# Patient Record
Sex: Male | Born: 1960 | Race: White | Hispanic: No | Marital: Married | Smoking: Never smoker
Health system: Southern US, Community
[De-identification: ages and names within clinical notes are randomized; demographics above are authoritative.]

## PROBLEM LIST (undated history)

## (undated) DIAGNOSIS — Z5189 Encounter for other specified aftercare: Secondary | ICD-10-CM

## (undated) DIAGNOSIS — D500 Iron deficiency anemia secondary to blood loss (chronic): Secondary | ICD-10-CM

## (undated) DIAGNOSIS — K651 Peritoneal abscess: Secondary | ICD-10-CM

## (undated) DIAGNOSIS — K922 Gastrointestinal hemorrhage, unspecified: Secondary | ICD-10-CM

## (undated) DIAGNOSIS — R51 Headache: Secondary | ICD-10-CM

## (undated) DIAGNOSIS — C189 Malignant neoplasm of colon, unspecified: Secondary | ICD-10-CM

## (undated) DIAGNOSIS — D649 Anemia, unspecified: Secondary | ICD-10-CM

## (undated) HISTORY — PX: COLON SURGERY: SHX602

## (undated) HISTORY — PX: ABDOMINAL EXPLORATION SURGERY: SHX538

## (undated) HISTORY — PX: TONSILLECTOMY: SUR1361

## (undated) HISTORY — DX: Anemia, unspecified: D64.9

## (undated) HISTORY — DX: Encounter for other specified aftercare: Z51.89

---

## 2011-06-09 HISTORY — PX: SMALL INTESTINE SURGERY: SHX150

## 2012-05-25 ENCOUNTER — Other Ambulatory Visit: Payer: Self-pay

## 2012-05-25 ENCOUNTER — Encounter (HOSPITAL_COMMUNITY): Admission: EM | Disposition: A | Discharge status: Home / Self Care | Payer: Self-pay | Source: Home / Self Care

## 2012-05-25 ENCOUNTER — Emergency Department (HOSPITAL_COMMUNITY): Payer: Managed Care, Other (non HMO)

## 2012-05-25 ENCOUNTER — Encounter (HOSPITAL_COMMUNITY): Payer: Self-pay | Admitting: *Deleted

## 2012-05-25 ENCOUNTER — Inpatient Hospital Stay (HOSPITAL_COMMUNITY)
Admission: EM | Admit: 2012-05-25 | Discharge: 2012-06-03 | DRG: 329 | Disposition: A | Discharge status: Home / Self Care | Payer: Managed Care, Other (non HMO) | Attending: General Surgery | Admitting: General Surgery

## 2012-05-25 DIAGNOSIS — K658 Other peritonitis: Secondary | ICD-10-CM | POA: Diagnosis present

## 2012-05-25 DIAGNOSIS — Y832 Surgical operation with anastomosis, bypass or graft as the cause of abnormal reaction of the patient, or of later complication, without mention of misadventure at the time of the procedure: Secondary | ICD-10-CM | POA: Diagnosis present

## 2012-05-25 DIAGNOSIS — K2980 Duodenitis without bleeding: Secondary | ICD-10-CM | POA: Diagnosis present

## 2012-05-25 DIAGNOSIS — D500 Iron deficiency anemia secondary to blood loss (chronic): Secondary | ICD-10-CM | POA: Clinically undetermined

## 2012-05-25 DIAGNOSIS — K922 Gastrointestinal hemorrhage, unspecified: Secondary | ICD-10-CM | POA: Clinically undetermined

## 2012-05-25 DIAGNOSIS — K929 Disease of digestive system, unspecified: Secondary | ICD-10-CM | POA: Diagnosis not present

## 2012-05-25 DIAGNOSIS — K560 Paralytic ileus: Secondary | ICD-10-CM | POA: Diagnosis not present

## 2012-05-25 DIAGNOSIS — Z85038 Personal history of other malignant neoplasm of large intestine: Secondary | ICD-10-CM

## 2012-05-25 DIAGNOSIS — K254 Chronic or unspecified gastric ulcer with hemorrhage: Secondary | ICD-10-CM | POA: Diagnosis present

## 2012-05-25 DIAGNOSIS — K631 Perforation of intestine (nontraumatic): Secondary | ICD-10-CM

## 2012-05-25 DIAGNOSIS — K668 Other specified disorders of peritoneum: Secondary | ICD-10-CM

## 2012-05-25 DIAGNOSIS — K55059 Acute (reversible) ischemia of intestine, part and extent unspecified: Secondary | ICD-10-CM

## 2012-05-25 DIAGNOSIS — K91858 Other complications of intestinal pouch: Principal | ICD-10-CM | POA: Diagnosis present

## 2012-05-25 DIAGNOSIS — K651 Peritoneal abscess: Secondary | ICD-10-CM | POA: Clinically undetermined

## 2012-05-25 DIAGNOSIS — Z860100 Personal history of colon polyps, unspecified: Secondary | ICD-10-CM

## 2012-05-25 HISTORY — DX: Iron deficiency anemia secondary to blood loss (chronic): D50.0

## 2012-05-25 HISTORY — DX: Peritoneal abscess: K65.1

## 2012-05-25 HISTORY — DX: Gastrointestinal hemorrhage, unspecified: K92.2

## 2012-05-25 HISTORY — DX: Malignant neoplasm of colon, unspecified: C18.9

## 2012-05-25 LAB — COMPREHENSIVE METABOLIC PANEL
AST: 28 U/L (ref 0–37)
Albumin: 4 g/dL (ref 3.5–5.2)
BUN: 19 mg/dL (ref 6–23)
Calcium: 9.3 mg/dL (ref 8.4–10.5)
Creatinine, Ser: 1.17 mg/dL (ref 0.50–1.35)
Total Protein: 7 g/dL (ref 6.0–8.3)

## 2012-05-25 LAB — LIPASE, BLOOD: Lipase: 67 U/L — ABNORMAL HIGH (ref 11–59)

## 2012-05-25 LAB — CBC
HCT: 45.8 % (ref 39.0–52.0)
MCHC: 34.9 g/dL (ref 30.0–36.0)
MCV: 89.6 fL (ref 78.0–100.0)
Platelets: 233 10*3/uL (ref 150–400)
RDW: 12.4 % (ref 11.5–15.5)

## 2012-05-25 LAB — LACTIC ACID, PLASMA: Lactic Acid, Venous: 5.8 mmol/L — ABNORMAL HIGH (ref 0.5–2.2)

## 2012-05-25 SURGERY — LAPAROTOMY, EXPLORATORY
Anesthesia: General

## 2012-05-25 MED ORDER — ONDANSETRON HCL 4 MG/2ML IJ SOLN
4.0000 mg | Freq: Once | INTRAMUSCULAR | Status: AC
  Administered 2012-05-25: 4 mg via INTRAVENOUS
  Filled 2012-05-25: qty 2

## 2012-05-25 MED ORDER — HYDROMORPHONE HCL PF 1 MG/ML IJ SOLN
1.0000 mg | Freq: Once | INTRAMUSCULAR | Status: AC
  Administered 2012-05-25: 1 mg via INTRAVENOUS
  Filled 2012-05-25: qty 1

## 2012-05-25 MED ORDER — ONDANSETRON HCL 4 MG/2ML IJ SOLN
INTRAMUSCULAR | Status: AC
  Filled 2012-05-25: qty 2

## 2012-05-25 MED ORDER — DEXTROSE 5 % IV SOLN
2.0000 g | INTRAVENOUS | Status: DC
  Filled 2012-05-25: qty 2

## 2012-05-25 NOTE — ED Provider Notes (Signed)
Medical screening examination/treatment/procedure(s) were conducted as a shared visit with non-physician practitioner(s) and myself.  I personally evaluated the patient during the encounter  Pt with prior colectomy now having sudden onset severe abdominal pain, rigid abdomen, free air. Surgical consult.   Charles B. Bernette Mayers, MD 05/25/12 2333

## 2012-05-25 NOTE — ED Provider Notes (Signed)
History     CSN: [id number]  Arrival date & time 05/25/12  2122   First MD Initiated Contact with Patient 05/25/12 2159      Chief Complaint  Patient presents with  . Emesis  . Abdominal Pain    (Consider location/radiation/quality/duration/timing/severity/associated sxs/prior treatment) HPI Patient presents emergency department with sudden onset of abdominal pain, nausea, vomiting, at about 8:30.  Patient, states he was eating dinner when he started with a sudden onset of abdominal pain, with nausea, and vomiting.  Patient, states that he's had a total colectomy, 2 and half years ago.  Patient denies any other past medical problems.  Patient denies taking any medications.  Patient, states that movement and palpation make his discomfort, worse.  Patient, also, states he started with discomfort between both shoulder blades at the onset of the pain.  Patient denies chest pain, syncope, dizziness, visual changes, dysuria, diarrhea, or hematemesis.  Past Medical History  Diagnosis Date  . Colon cancer     Past Surgical History  Procedure Date  . Colon surgery     History reviewed. No pertinent family history.  History  Substance Use Topics  . Smoking status: Never Smoker   . Smokeless tobacco: Not on file  . Alcohol Use: Yes     Comment: occasionally      Review of Systems All other systems negative except as documented in the HPI. All pertinent positives and negatives as reviewed in the HPI.  Allergies  Review of patient's allergies indicates no known allergies.  Home Medications   Current Outpatient Rx  Name  Route  Sig  Dispense  Refill  . LOPERAMIDE HCL 2 MG PO CAPS   Oral   Take 6 mg by mouth 2 (two) times daily. Slowing down of bowel movement           BP 135/75  Pulse 85  Temp 97.5 F (36.4 C) (Oral)  Resp 16  SpO2 96%  Physical Exam  Nursing note and vitals reviewed. Constitutional: He is oriented to person, place, and time. He appears  well-developed and well-nourished.  HENT:  Head: Normocephalic and atraumatic.  Mouth/Throat: Oropharynx is clear and moist.  Eyes: Pupils are equal, round, and reactive to light.  Neck: Normal range of motion. Neck supple.  Cardiovascular: Normal rate, regular rhythm and normal heart sounds.  Exam reveals no gallop and no friction rub.   No murmur heard. Pulmonary/Chest: Effort normal and breath sounds normal. No respiratory distress.  Abdominal: Normal appearance. Bowel sounds are decreased. There is generalized tenderness. There is rigidity and guarding.  Neurological: He is alert and oriented to person, place, and time.  Skin: He is diaphoretic. There is pallor.    ED Course  Procedures (including critical care time)   Labs Reviewed  CBC  POCT I-STAT TROPONIN I  COMPREHENSIVE METABOLIC PANEL  LIPASE, BLOOD  URINALYSIS, MICROSCOPIC ONLY  LACTIC ACID, PLASMA   Dg Abd Acute W/chest  05/25/2012  *RADIOLOGY REPORT*  Clinical Data: Mid abdominal pain, nausea/vomiting  ACUTE ABDOMEN SERIES (ABDOMEN 2 VIEW & CHEST 1 VIEW)  Comparison: None.  Findings: Mild bibasilar opacities, likely atelectasis. No pleural effusion or pneumothorax.  Cardiomediastinal silhouette is within normal limits.  Nonspecific bowel gas pattern without disproportionate small bowel dilatation to suggest small bowel obstruction.  Small volume pneumoperitoneum under the diaphragms on the erect view.  Surgical clips overlying the pelvis.  IMPRESSION: Small volume pneumoperitoneum.  In the absence of recent intervention, CT abdomen pelvis is suggested for  further evaluation.  No evidence of acute cardiopulmonary disease.  Critical Value/emergent results were called by telephone at the time of interpretation on 05/25/2012 at 2310 hours to [facility], who verbally acknowledged these results.   Original Report Authenticated By: Charline Bills, M.D.    I was concerned about the patient, upon examination, I called  x-ray to move him to the front of the line for an acute abdominal series.  The patient has rigidity to the abdomen.  I called the radiologist after reviewing the films myself and noting free air under both diaphragms.  The radiologist agreed with this assessment.  I spoke with Dr. Lindie Spruce, from general surgery, who is coming to see the patient.  MDM  CRITICAL CARE Performed by: Carlyle Dolly   Total critical care time: 40  Critical care time was exclusive of separately billable procedures and treating other patients.  Critical care was necessary to treat or prevent imminent or life-threatening deterioration.  Critical care was time spent personally by me on the following activities: development of treatment plan with patient and/or surrogate as well as nursing, discussions with consultants, evaluation of patient's response to treatment, examination of patient, obtaining history from patient or surrogate, ordering and performing treatments and interventions, ordering and review of laboratory studies, ordering and review of radiographic studies, pulse oximetry and re-evaluation of patient's condition.   MDM Reviewed: nursing note and vitals Interpretation: labs, x-ray and ECG Consults: general surgery          Carlyle Dolly, PA-C 05/25/12 2325

## 2012-05-25 NOTE — ED Notes (Signed)
Pt vomited 500 ml, orders for zofran.  PA aware.

## 2012-05-25 NOTE — ED Notes (Signed)
Pt transported to xray 

## 2012-05-25 NOTE — H&P (Signed)
Gabriel Lee is an 51 y.o. male.   Chief Complaint: Severe abdominal pain. HPI: History of total abdominal colectomy in late 2009 with ileoanal anastomosis, was having a normal evening, had just eaten dinner at [employer], was visiting friends, developed severe abdominal pain, and now is rigid with plain X-rays showing significant free air.  OR ASAP.  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  As a teenager he had an exploratory laparotomy for trauma, which by his report was non-therapeutic.  Has upper midline abdominal scar.  No history of ulcer disease or NSAIAs usage.  Pain was sudden in onset  Past Medical History  Diagnosis Date  . Colon cancer     Past Surgical History  Procedure Date  . Colon surgery     History reviewed. No pertinent family history. Social History:  reports that he has never smoked. He does not have any smokeless tobacco history on file. He reports that he drinks alcohol. He reports that he does not use illicit drugs.  Allergies: No Known Allergies   (Not in a hospital admission)  Results for orders placed during the hospital encounter of 05/25/12 (from the past 48 hour(s))  CBC     Status: Normal   Collection Time   05/25/12  9:44 PM      Component Value Range Comment   WBC 10.2  4.0 - 10.5 K/uL    RBC 5.11  4.22 - 5.81 MIL/uL    Hemoglobin 16.0  13.0 - 17.0 g/dL    HCT 16.1  09.6 - 04.5 %    MCV 89.6  78.0 - 100.0 fL    MCH 31.3  26.0 - 34.0 pg    MCHC 34.9  30.0 - 36.0 g/dL    RDW 40.9  81.1 - 91.4 %    Platelets 233  150 - 400 K/uL   POCT I-STAT TROPONIN I     Status: Normal   Collection Time   05/25/12 10:07 PM      Component Value Range Comment   Troponin i, poc 0.00  0.00 - 0.08 ng/mL    Comment 3            LACTIC ACID, PLASMA     Status: Abnormal   Collection Time   05/25/12 10:47 PM      Component Value Range Comment   Lactic Acid, Venous 5.8 (*) 0.5 - 2.2  mmol/L    Dg Abd Acute W/chest  05/25/2012  *RADIOLOGY REPORT*  Clinical Data: Mid abdominal pain, nausea/vomiting  ACUTE ABDOMEN SERIES (ABDOMEN 2 VIEW & CHEST 1 VIEW)  Comparison: None.  Findings: Mild bibasilar opacities, likely atelectasis. No pleural effusion or pneumothorax.  Cardiomediastinal silhouette is within normal limits.  Nonspecific bowel gas pattern without disproportionate small bowel dilatation to suggest small bowel obstruction.  Small volume pneumoperitoneum under the diaphragms on the erect view.  Surgical clips overlying the pelvis.  IMPRESSION: Small volume pneumoperitoneum.  In the absence of recent intervention, CT abdomen pelvis is suggested for further evaluation.  No evidence of acute cardiopulmonary disease.  Critical Value/emergent results were called by telephone at the time of interpretation on 05/25/2012 at 2310 hours to [facility], who verbally acknowledged these results.   Original Report Authenticated By: Charline Bills, M.D.     Review of Systems  Constitutional: Negative for fever and chills.  Gastrointestinal: Positive for nausea, vomiting, abdominal pain and diarrhea (chronic).  Genitourinary: Negative.   Musculoskeletal: Negative.  Skin: Negative.   Neurological: Negative.   Endo/Heme/Allergies: Negative.   Psychiatric/Behavioral: Negative.     Blood pressure 135/75, pulse 85, temperature 97.5 F (36.4 C), temperature source Oral, resp. rate 16, SpO2 96.00%. Physical Exam  Constitutional: He is oriented to person, place, and time. He appears well-developed and well-nourished. He has a sickly appearance. He appears ill (Pale).  HENT:  Head: Normocephalic and atraumatic.  Eyes: Conjunctivae normal and EOM are normal. Pupils are equal, round, and reactive to light.  Neck: Normal range of motion. Neck supple.  Cardiovascular: Normal rate, regular rhythm and normal heart sounds.   Respiratory: Effort normal and breath sounds normal.  GI:  Bowel sounds are absent. There is generalized tenderness. There is rigidity, rebound and guarding.  Musculoskeletal: Normal range of motion.  Neurological: He is alert and oriented to person, place, and time.  Skin: Skin is warm and dry.  Psychiatric: He has a normal mood and affect. His behavior is normal. Judgment and thought content normal.     Assessment/Plan Free air and peritonitis in patient with history of total colectomy and ileorectal anastomosis. No history of ulcer disease. Most likely perforated ulcer, but could be small bowel perforation or Meckel's perforation with peritonitis.  He has no colon left to perforate.  Exploratory laparotomy with repair and possible bowel resection.  KALIB, BHAGAT 05/25/2012, 11:33 PM

## 2012-05-25 NOTE — ED Notes (Addendum)
Per EMS: pt ate dinner 1 hour ago, 30 minutes later started profusely vomiting.  C/o acute generalized abdominal pain.  (Ate beef short ribs).  Hx colon CA, no other GI problems.  Initial BP 70/palp.  Received 4 mg IV Zofran.  BP now 126/85

## 2012-05-26 ENCOUNTER — Emergency Department (HOSPITAL_COMMUNITY): Payer: Managed Care, Other (non HMO) | Admitting: Certified Registered Nurse Anesthetist

## 2012-05-26 ENCOUNTER — Encounter (HOSPITAL_COMMUNITY): Admission: EM | Disposition: A | Discharge status: Home / Self Care | Payer: Self-pay | Source: Home / Self Care

## 2012-05-26 ENCOUNTER — Encounter (HOSPITAL_COMMUNITY): Payer: Self-pay | Admitting: Certified Registered Nurse Anesthetist

## 2012-05-26 HISTORY — PX: LAPAROTOMY: SHX154

## 2012-05-26 LAB — TYPE AND SCREEN
ABO/RH(D): A POS
Antibody Screen: POSITIVE

## 2012-05-26 LAB — URINALYSIS, MICROSCOPIC ONLY
Ketones, ur: 15 mg/dL — AB
Leukocytes, UA: NEGATIVE
Nitrite: NEGATIVE
Specific Gravity, Urine: 1.029 (ref 1.005–1.030)
pH: 5 (ref 5.0–8.0)

## 2012-05-26 LAB — CBC
Hemoglobin: 17.2 g/dL — ABNORMAL HIGH (ref 13.0–17.0)
MCH: 30.2 pg (ref 26.0–34.0)
MCHC: 33.5 g/dL (ref 30.0–36.0)
MCV: 90 fL (ref 78.0–100.0)

## 2012-05-26 SURGERY — LAPAROTOMY, EXPLORATORY
Anesthesia: General | Site: Abdomen | Wound class: Clean Contaminated

## 2012-05-26 MED ORDER — PROPOFOL 10 MG/ML IV BOLUS
INTRAVENOUS | Status: DC | PRN
  Administered 2012-05-26: 160 mg via INTRAVENOUS

## 2012-05-26 MED ORDER — 0.9 % SODIUM CHLORIDE (POUR BTL) OPTIME
TOPICAL | Status: DC | PRN
  Administered 2012-05-26 (×7): 1000 mL

## 2012-05-26 MED ORDER — KCL IN DEXTROSE-NACL 20-5-0.45 MEQ/L-%-% IV SOLN
INTRAVENOUS | Status: DC
  Administered 2012-05-26 – 2012-05-30 (×13): via INTRAVENOUS
  Filled 2012-05-26 (×18): qty 1000

## 2012-05-26 MED ORDER — 0.9 % SODIUM CHLORIDE (POUR BTL) OPTIME
TOPICAL | Status: DC | PRN
  Administered 2012-05-26 (×2): 1000 mL

## 2012-05-26 MED ORDER — DIPHENHYDRAMINE HCL 12.5 MG/5ML PO ELIX
12.5000 mg | ORAL_SOLUTION | Freq: Four times a day (QID) | ORAL | Status: DC | PRN
  Filled 2012-05-26: qty 5

## 2012-05-26 MED ORDER — PROMETHAZINE HCL 25 MG/ML IJ SOLN: 6.2500 mg | INTRAMUSCULAR | Status: DC | PRN

## 2012-05-26 MED ORDER — SUCCINYLCHOLINE CHLORIDE 20 MG/ML IJ SOLN
INTRAMUSCULAR | Status: DC | PRN
  Administered 2012-05-26: 100 mg via INTRAVENOUS

## 2012-05-26 MED ORDER — BIOTENE DRY MOUTH MT LIQD
15.0000 mL | Freq: Two times a day (BID) | OROMUCOSAL | Status: DC
  Administered 2012-05-26 – 2012-06-01 (×11): 15 mL via OROMUCOSAL

## 2012-05-26 MED ORDER — HYDROMORPHONE HCL PF 1 MG/ML IJ SOLN
0.2500 mg | INTRAMUSCULAR | Status: DC | PRN
  Administered 2012-05-26 (×2): 0.5 mg via INTRAVENOUS

## 2012-05-26 MED ORDER — LACTATED RINGERS IV SOLN
INTRAVENOUS | Status: DC | PRN
  Administered 2012-05-26: 01:00:00 via INTRAVENOUS

## 2012-05-26 MED ORDER — NALOXONE HCL 0.4 MG/ML IJ SOLN: 0.4000 mg | INTRAMUSCULAR | Status: DC | PRN

## 2012-05-26 MED ORDER — HYDROMORPHONE 0.3 MG/ML IV SOLN
INTRAVENOUS | Status: DC
  Administered 2012-05-26: 13:00:00 via INTRAVENOUS
  Administered 2012-05-26: 1.2 mg via INTRAVENOUS
  Administered 2012-05-26: 3.8 mg via INTRAVENOUS
  Administered 2012-05-26: 2.7 mg via INTRAVENOUS
  Administered 2012-05-26: 04:00:00 via INTRAVENOUS
  Administered 2012-05-27 (×3): 0.6 mg via INTRAVENOUS
  Administered 2012-05-27: 10:00:00 via INTRAVENOUS
  Administered 2012-05-27 (×2): 1.8 mg via INTRAVENOUS
  Administered 2012-05-28: 0.3 mg via INTRAVENOUS
  Administered 2012-05-28: 0.9 mg via INTRAVENOUS
  Administered 2012-05-28: 0.6 mg via INTRAVENOUS
  Administered 2012-05-28 – 2012-05-29 (×3): 0.3 mg via INTRAVENOUS
  Filled 2012-05-26 (×3): qty 25

## 2012-05-26 MED ORDER — SODIUM CHLORIDE 0.9 % IV SOLN
INTRAVENOUS | Status: DC | PRN
  Administered 2012-05-26: via INTRAVENOUS

## 2012-05-26 MED ORDER — METOPROLOL TARTRATE 1 MG/ML IV SOLN
5.0000 mg | Freq: Four times a day (QID) | INTRAVENOUS | Status: DC
  Administered 2012-05-26 – 2012-05-30 (×17): 5 mg via INTRAVENOUS
  Filled 2012-05-26 (×20): qty 5

## 2012-05-26 MED ORDER — DEXTROSE 5 % IV SOLN
2.0000 g | INTRAVENOUS | Status: DC | PRN
  Administered 2012-05-26: 2 g via INTRAVENOUS

## 2012-05-26 MED ORDER — ROCURONIUM BROMIDE 100 MG/10ML IV SOLN
INTRAVENOUS | Status: DC | PRN
  Administered 2012-05-26: 50 mg via INTRAVENOUS
  Administered 2012-05-26: 10 mg via INTRAVENOUS
  Administered 2012-05-26: 20 mg via INTRAVENOUS

## 2012-05-26 MED ORDER — ONDANSETRON HCL 4 MG/2ML IJ SOLN
4.0000 mg | Freq: Four times a day (QID) | INTRAMUSCULAR | Status: DC | PRN
Start: 2012-05-26 — End: 2012-05-29

## 2012-05-26 MED ORDER — SODIUM CHLORIDE 0.9 % IV SOLN
1.0000 g | INTRAVENOUS | Status: DC
  Administered 2012-05-26 – 2012-06-03 (×9): 1 g via INTRAVENOUS
  Filled 2012-05-26 (×10): qty 1

## 2012-05-26 MED ORDER — FENTANYL CITRATE 0.05 MG/ML IJ SOLN
INTRAMUSCULAR | Status: DC | PRN
  Administered 2012-05-26: 100 ug via INTRAVENOUS
  Administered 2012-05-26 (×3): 50 ug via INTRAVENOUS

## 2012-05-26 MED ORDER — ENOXAPARIN SODIUM 40 MG/0.4ML ~~LOC~~ SOLN
40.0000 mg | Freq: Every day | SUBCUTANEOUS | Status: DC
  Administered 2012-05-27 – 2012-05-31 (×6): 40 mg via SUBCUTANEOUS
  Filled 2012-05-26 (×7): qty 0.4

## 2012-05-26 MED ORDER — OXYCODONE HCL 5 MG/5ML PO SOLN: 5.0000 mg | Freq: Once | ORAL | Status: DC | PRN

## 2012-05-26 MED ORDER — MEPERIDINE HCL 25 MG/ML IJ SOLN: 6.2500 mg | INTRAMUSCULAR | Status: DC | PRN

## 2012-05-26 MED ORDER — SODIUM CHLORIDE 0.9 % IV SOLN
500.0000 mL | Freq: Once | INTRAVENOUS | Status: AC
  Administered 2012-05-26: 500 mL via INTRAVENOUS

## 2012-05-26 MED ORDER — ALBUMIN HUMAN 5 % IV SOLN
25.0000 g | Freq: Once | INTRAVENOUS | Status: AC
  Administered 2012-05-26: 25 g via INTRAVENOUS
  Filled 2012-05-26: qty 500

## 2012-05-26 MED ORDER — POVIDONE-IODINE 10 % EX OINT
TOPICAL_OINTMENT | CUTANEOUS | Status: AC
  Filled 2012-05-26: qty 28.35

## 2012-05-26 MED ORDER — SODIUM CHLORIDE 0.9 % IJ SOLN: 9.0000 mL | INTRAMUSCULAR | Status: DC | PRN

## 2012-05-26 MED ORDER — OXYCODONE HCL 5 MG PO TABS: 5.0000 mg | ORAL_TABLET | Freq: Once | ORAL | Status: DC | PRN

## 2012-05-26 MED ORDER — ONDANSETRON HCL 4 MG/2ML IJ SOLN
INTRAMUSCULAR | Status: DC | PRN
  Administered 2012-05-26: 4 mg via INTRAVENOUS

## 2012-05-26 MED ORDER — SODIUM CHLORIDE 0.9 % IV BOLUS (SEPSIS)
500.0000 mL | Freq: Once | INTRAVENOUS | Status: AC
  Administered 2012-05-26: 500 mL via INTRAVENOUS

## 2012-05-26 MED ORDER — LIDOCAINE HCL (CARDIAC) 20 MG/ML IV SOLN
INTRAVENOUS | Status: DC | PRN
  Administered 2012-05-26: 70 mg via INTRAVENOUS

## 2012-05-26 MED ORDER — GLYCOPYRROLATE 0.2 MG/ML IJ SOLN
INTRAMUSCULAR | Status: DC | PRN
  Administered 2012-05-26: .6 mg via INTRAVENOUS

## 2012-05-26 MED ORDER — NEOSTIGMINE METHYLSULFATE 1 MG/ML IJ SOLN
INTRAMUSCULAR | Status: DC | PRN
  Administered 2012-05-26: 4 mg via INTRAVENOUS

## 2012-05-26 MED ORDER — DIPHENHYDRAMINE HCL 50 MG/ML IJ SOLN: 12.5000 mg | Freq: Four times a day (QID) | INTRAMUSCULAR | Status: DC | PRN

## 2012-05-26 SURGICAL SUPPLY — 49 items
BLADE SURG ROTATE 9660 (MISCELLANEOUS) ×2 IMPLANT
CANISTER SUCTION 2500CC (MISCELLANEOUS) ×8 IMPLANT
CANISTER WOUND CARE 500ML ATS (WOUND CARE) ×2 IMPLANT
CHLORAPREP W/TINT 26ML (MISCELLANEOUS) ×2 IMPLANT
CLOTH BEACON ORANGE TIMEOUT ST (SAFETY) ×2 IMPLANT
COVER SURGICAL LIGHT HANDLE (MISCELLANEOUS) ×2 IMPLANT
DRAPE LAPAROSCOPIC ABDOMINAL (DRAPES) ×2 IMPLANT
DRAPE PROXIMA HALF (DRAPES) ×2 IMPLANT
DRAPE UTILITY 15X26 W/TAPE STR (DRAPE) ×4 IMPLANT
DRAPE WARM FLUID 44X44 (DRAPE) ×2 IMPLANT
DRSG VAC ATS MED SENSATRAC (GAUZE/BANDAGES/DRESSINGS) ×2 IMPLANT
ELECT BLADE 6.5 EXT (BLADE) IMPLANT
ELECT REM PT RETURN 9FT ADLT (ELECTROSURGICAL) ×2
ELECTRODE REM PT RTRN 9FT ADLT (ELECTROSURGICAL) ×1 IMPLANT
GLOVE BIO SURGEON STRL SZ7 (GLOVE) ×4 IMPLANT
GLOVE BIO SURGEON STRL SZ8 (GLOVE) ×4 IMPLANT
GLOVE BIOGEL PI IND STRL 7.5 (GLOVE) ×2 IMPLANT
GLOVE BIOGEL PI IND STRL 8 (GLOVE) ×1 IMPLANT
GLOVE BIOGEL PI INDICATOR 7.5 (GLOVE) ×2
GLOVE BIOGEL PI INDICATOR 8 (GLOVE) ×1
GLOVE ECLIPSE 7.5 STRL STRAW (GLOVE) ×4 IMPLANT
GLOVE SURG SS PI 7.5 STRL IVOR (GLOVE) ×2 IMPLANT
GOWN STRL NON-REIN LRG LVL3 (GOWN DISPOSABLE) ×6 IMPLANT
GOWN STRL REIN XL XLG (GOWN DISPOSABLE) ×2 IMPLANT
KIT BASIN OR (CUSTOM PROCEDURE TRAY) ×2 IMPLANT
KIT ROOM TURNOVER OR (KITS) ×2 IMPLANT
LIGASURE IMPACT 36 18CM CVD LR (INSTRUMENTS) IMPLANT
NS IRRIG 1000ML POUR BTL (IV SOLUTION) ×18 IMPLANT
PACK GENERAL/GYN (CUSTOM PROCEDURE TRAY) ×2 IMPLANT
PAD ARMBOARD 7.5X6 YLW CONV (MISCELLANEOUS) ×4 IMPLANT
SEPRAFILM PROCEDURAL PACK 3X5 (MISCELLANEOUS) ×2 IMPLANT
SPECIMEN JAR SMALL (MISCELLANEOUS) ×4 IMPLANT
SPECIMEN JAR X LARGE (MISCELLANEOUS) IMPLANT
SPONGE GAUZE 4X4 12PLY (GAUZE/BANDAGES/DRESSINGS) IMPLANT
SPONGE LAP 18X18 X RAY DECT (DISPOSABLE) ×4 IMPLANT
STAPLER GUN LINEAR PROX 60 (STAPLE) ×4 IMPLANT
STAPLER PROXIMATE 55 BLUE (STAPLE) ×2 IMPLANT
STAPLER VISISTAT 35W (STAPLE) IMPLANT
SUCTION POOLE TIP (SUCTIONS) ×4 IMPLANT
SUT PDS AB 1 TP1 96 (SUTURE) ×4 IMPLANT
SUT SILK 2 0 SH CR/8 (SUTURE) ×2 IMPLANT
SUT SILK 2 0 TIES 10X30 (SUTURE) ×2 IMPLANT
SUT SILK 3 0 SH CR/8 (SUTURE) ×4 IMPLANT
SUT SILK 3 0 TIES 10X30 (SUTURE) ×2 IMPLANT
TOWEL OR 17X24 6PK STRL BLUE (TOWEL DISPOSABLE) ×2 IMPLANT
TOWEL OR 17X26 10 PK STRL BLUE (TOWEL DISPOSABLE) ×2 IMPLANT
TRAY FOLEY CATH 14FRSI W/METER (CATHETERS) ×2 IMPLANT
WATER STERILE IRR 1000ML POUR (IV SOLUTION) IMPLANT
YANKAUER SUCT BULB TIP NO VENT (SUCTIONS) IMPLANT

## 2012-05-26 NOTE — Transfer of Care (Signed)
Immediate Anesthesia Transfer of Care Note  Patient: Gabriel Lee  Procedure(s) Performed: Procedure(s) (LRB) with comments: EXPLORATORY LAPAROTOMY (N/A)  Patient Location: PACU  Anesthesia Type:General  Level of Consciousness: awake, alert  and oriented  Airway & Oxygen Therapy: Patient connected to nasal cannula oxygen  Post-op Assessment: Report given to PACU RN, Post -op Vital signs reviewed and stable and Patient moving all extremities X 4  Post vital signs: Reviewed and stable  Complications: No apparent anesthesia complications

## 2012-05-26 NOTE — Preoperative (Signed)
Beta Blockers   Reason not to administer Beta Blockers:Not Applicable 

## 2012-05-26 NOTE — Op Note (Signed)
OPERATIVE REPORT  DATE OF OPERATION: 05/25/2012 - 05/26/2012  PATIENT:  Gabriel Lee  51 y.Lee. male  PRE-OPERATIVE DIAGNOSIS:  Perforated ileal/jejunal pouch  POST-OPERATIVE DIAGNOSIS:  Same with severe enteric peritonitis  PROCEDURE:  Procedure(s): EXPLORATORY LAPAROTOMY ENTERECTOMY\PARTIAL RESECTION OF POUCH  SURGEON:  Surgeon(s): Cherylynn Ridges, MD  ASSISTANT: Corliss Skains  ANESTHESIA:   general  EBL: <100 ml  BLOOD ADMINISTERED: none  DRAINS: Nasogastric Tube, Urinary Catheter (Foley) and Wound VAC   SPECIMEN:  Source of Specimen:  Resected pouch and EB segment  COUNTS CORRECT:  YES  PROCEDURE DETAILS: The patient was taken to the operating room and placed on the table in the supine position. After an adequate general endotracheal anesthetic was administered he was prepped and draped in the usual sterile manner exposing the entire abdomen.  The patient was brought to the operating room because of a significant amount of free air and peritonitis. After proper time out was performed identifying the patient and the procedure be a midline incision was made in the upper one third of his previous midline incision. It was taken down to and through the midline fascia. Old sutures of Ethibond were seen that were placed in an interrupted manner. We extended the incision down to the umbilicus and down into the peritoneal cavity safely. Does a with a foul-smelling odor upon entering the peritoneal cavity. Once we fully down into the peritoneal cavity a foul-smelling greenish fluid was noted along with solid particles contained within. It was felt as though this likely represent a more than a perforated ulcer and inspecting in that area did not demonstrate any evidence of ulceration or perforation.  We ran the small bowel from the ligament of Treitz down towards the previous ilio rectal anastomosis. About midway through the jejunum headed towards the proximal ileum there appeared to be a  connection between the sidewall of the jejunum and a pouch like structure which was leaking stool-like contents into the peritoneal cavity. This was the area where the contamination in the perforation came from. There was a significant peritonitis in the pelvis especially on the right side. There was gross of enteric contamination of all 4 quadrants of the abdominal cavity.  The most significant area of inflammation was in the pelvis and right lower quadrant. There was stool I contamination in that area. The perforation was in the anterior portion of this pouchlike structure which appeared to be partially ischemic we opened the area perforation and attempted to cannulate the small bowel which appeared to be connected to it surgically however this demonstrated that the apparent connection was just a dense adhesion which may upon further time fistulized however did not represent a surgical anastomosis  We followed the terminal ileum down into the pelvis from what appeared to be this large polyp was a loop of the distal small bowel which had probably been anastomosed to the rectum as a double-J type pouch. It was this enlarged and stretched out and partially ischemic double-J pouch which appear to perforate. We carefully came across with a TA 60 stapler with the 3.5 mm closure blue stapling device. We resected the excess pouch and sent as a separate specimen. The small bowel that was attached to it had to be resected with primary anastomosis with a GIA 55 stapler. The resulting enterotomy was closed using a TA 60 stapling device. This considerably cut down the size of this distal pouch however it was resected back to what appeared to be viable bowel.  We subsequently  change of gloves and irrigated with copious amounts of warm saline solution. After irrigating thoroughly we reapproximated the fashion using a running looped #1 PDS suture. The subcutaneous tissue was left open and close with a black sponge negative  pressure wound covering. All counts were correct.  PATIENT DISPOSITION:  PACU - hemodynamically stable.   Gabriel Lee, Gabriel Lee 12/19/20132:47 AM

## 2012-05-26 NOTE — Anesthesia Preprocedure Evaluation (Addendum)
Anesthesia Evaluation  Patient identified by MRN, date of birth, ID band Patient awake  General Assessment Comment:Acutely toxic appearing , tachypneic  Reviewed: Allergy & Precautions, H&P , NPO status , Patient's Chart, lab work & pertinent test results  History of Anesthesia Complications Negative for: history of anesthetic complications  Airway Mallampati: I TM Distance: >3 FB Neck ROM: Full    Dental  (+) Teeth Intact and Dental Advisory Given   Pulmonary neg pulmonary ROS,  breath sounds clear to auscultation        Cardiovascular negative cardio ROS  Rhythm:Regular Rate:Normal     Neuro/Psych negative neurological ROS  negative psych ROS   GI/Hepatic Neg liver ROS, Hx colon cancer; colectomy   Endo/Other  negative endocrine ROS  Renal/GU negative Renal ROS  negative genitourinary   Musculoskeletal negative musculoskeletal ROS (+)   Abdominal   Peds  Hematology negative hematology ROS (+)   Anesthesia Other Findings   Reproductive/Obstetrics                          Anesthesia Physical Anesthesia Plan  ASA: III and emergent  Anesthesia Plan: General   Post-op Pain Management:    Induction: Intravenous, Rapid sequence and Cricoid pressure planned  Airway Management Planned: Oral ETT  Additional Equipment:   Intra-op Plan:   Post-operative Plan: Extubation in OR  Informed Consent:   Dental advisory given  Plan Discussed with: Anesthesiologist and Surgeon  Anesthesia Plan Comments:         Anesthesia Quick Evaluation

## 2012-05-26 NOTE — Progress Notes (Signed)
Patient ID: Gabriel Lee, male   DOB: 1961/03/20, 51 y.o.   MRN: [id number]   LOS: 1 day  POD#1  Subjective: Pain decently controlled with PCA. No flatus (though this is rare for him baseline).  Objective: Vital signs in last 24 hours: Temp:  [97.5 F (36.4 C)-98.6 F (37 C)] 97.8 F (36.6 C) (12/19 0600) Pulse Rate:  [85-141] 141  (12/19 0600) Resp:  [15-23] 15  (12/19 0806) BP: (127-148)/(75-100) 136/92 mmHg (12/19 0602) SpO2:  [94 %-98 %] 95 % (12/19 0806) Weight:  [203 lb 11.3 oz (92.4 kg)] 203 lb 11.3 oz (92.4 kg) (12/19 0753) Last BM Date: 05/25/12   NGT:   General appearance: alert and no distress Resp: clear to auscultation bilaterally Cardio: Tachycardia GI: Soft, minimal to absent BS   Assessment/Plan: SB perf s/p SBR  Ileus -- Continue NGT Tachycardia -- Will check EKG, give lopressor   Freeman Caldron, PA-C Pager: [phone] General Trauma PA Pager: [phone]   05/26/2012

## 2012-05-26 NOTE — ED Notes (Signed)
Report given to [facility], RN in [place].  Acknowledged that pt had not received abx.  Type and screen was done.

## 2012-05-26 NOTE — Progress Notes (Signed)
Patient interviewed and examined, agree with PA note above.  Mariella Saa MD, FACS  05/26/2012 6:28 PM

## 2012-05-26 NOTE — Progress Notes (Signed)
92 Dr. Lindie Spruce notified of hr 130 to 138. Orders received. NS bolus started 0446. Will continue to monitor.

## 2012-05-26 NOTE — Anesthesia Procedure Notes (Signed)
Procedure Name: Intubation Date/Time: 05/26/2012 12:37 AM Performed by: Molli Hazard Pre-anesthesia Checklist: Patient identified, Emergency Drugs available, Suction available and Patient being monitored Patient Re-evaluated:Patient Re-evaluated prior to inductionOxygen Delivery Method: Circle system utilized Preoxygenation: Pre-oxygenation with 100% oxygen Intubation Type: IV induction, Rapid sequence and Cricoid Pressure applied Laryngoscope Size: Miller and 2 Grade View: Grade I Tube type: Oral Tube size: 8.0 mm Number of attempts: 1 Airway Equipment and Method: Stylet Placement Confirmation: ETT inserted through vocal cords under direct vision,  positive ETCO2 and breath sounds checked- equal and bilateral Secured at: 24 cm Tube secured with: Tape Dental Injury: Teeth and Oropharynx as per pre-operative assessment

## 2012-05-26 NOTE — Progress Notes (Signed)
4782 Dr. Lindie Spruce notified of HR 140-145. Orders received. 0622 NS bolus started. Patient states pain is better. Wife at bedside. Will continue to monitor.

## 2012-05-26 NOTE — Anesthesia Postprocedure Evaluation (Signed)
  Anesthesia Post-op Note  Patient: Gabriel Lee  Procedure(s) Performed: Procedure(s) (LRB) with comments: EXPLORATORY LAPAROTOMY (N/A)  Patient Location: PACU  Anesthesia Type:General  Level of Consciousness: awake and sedated  Airway and Oxygen Therapy: Patient Spontanous Breathing  Post-op Pain: moderate  Post-op Assessment: Post-op Vital signs reviewed, Patient's Cardiovascular Status Stable and Respiratory Function Stable  Post-op Vital Signs: stable  Complications: No apparent anesthesia complications

## 2012-05-27 LAB — CBC
HCT: 41.1 % (ref 39.0–52.0)
MCH: 30.8 pg (ref 26.0–34.0)
MCV: 89 fL (ref 78.0–100.0)
Platelets: 161 10*3/uL (ref 150–400)
Platelets: 132 10*3/uL — ABNORMAL LOW (ref 150–400)
RDW: 12.8 % (ref 11.5–15.5)
RDW: 12.9 % (ref 11.5–15.5)
WBC: 13.4 10*3/uL — ABNORMAL HIGH (ref 4.0–10.5)

## 2012-05-27 LAB — BASIC METABOLIC PANEL
Calcium: 8.2 mg/dL — ABNORMAL LOW (ref 8.4–10.5)
Creatinine, Ser: 1.1 mg/dL (ref 0.50–1.35)
GFR calc Af Amer: 89 mL/min — ABNORMAL LOW (ref >90)

## 2012-05-27 MED ORDER — PHENOL 1.4 % MT LIQD: 1.0000 | OROMUCOSAL | Status: DC | PRN

## 2012-05-27 MED ORDER — PHENOL 1.4 % MT LIQD
1.0000 | OROMUCOSAL | Status: DC | PRN
  Filled 2012-05-27 (×2): qty 177

## 2012-05-27 NOTE — Progress Notes (Signed)
1 Day Post-Op  Subjective: POD#1 comfortable  Objective: Vital signs in last 24 hours: Temp:  [97.9 F (36.6 C)-98.4 F (36.9 C)] 98.3 F (36.8 C) (12/20 0528) Pulse Rate:  [88-148] 120  (12/20 0528) Resp:  [16-28] 25  (12/20 0811) BP: (93-123)/(63-79) 119/79 mmHg (12/20 0528) SpO2:  [94 %-99 %] 96 % (12/20 0811) Last BM Date: 05/25/12  Intake/Output from previous day: 12/19 0701 - 12/20 0700 In: 4790.2 [P.O.:240; I.V.:4050.2; IV Piggyback:500] Out: 1951 [Urine:1225; Emesis/NG output:700; Drains:25; Stool:1] Intake/Output this shift:    Lungs clear Abdomen soft, VAC in place  Lab Results:   Basename 05/27/12 0600 05/26/12 1256  WBC 13.2* 4.6  HGB 15.9 17.2*  HCT 46.0 51.3  PLT 161 223   BMET  Basename 05/27/12 0600 05/25/12 2152  NA 141 142  K 4.1 3.0*  CL 107 101  CO2 26 24  GLUCOSE 122* 117*  BUN 21 19  CREATININE 1.10 1.17  CALCIUM 8.2* 9.3   PT/INR No results found for this basename: LABPROT:2,INR:2 in the last 72 hours ABG No results found for this basename: PHART:2,PCO2:2,PO2:2,HCO3:2 in the last 72 hours  Studies/Results: Dg Abd Acute W/chest  05/25/2012  *RADIOLOGY REPORT*  Clinical Data: Mid abdominal pain, nausea/vomiting  ACUTE ABDOMEN SERIES (ABDOMEN 2 VIEW & CHEST 1 VIEW)  Comparison: None.  Findings: Mild bibasilar opacities, likely atelectasis. No pleural effusion or pneumothorax.  Cardiomediastinal silhouette is within normal limits.  Nonspecific bowel gas pattern without disproportionate small bowel dilatation to suggest small bowel obstruction.  Small volume pneumoperitoneum under the diaphragms on the erect view.  Surgical clips overlying the pelvis.  IMPRESSION: Small volume pneumoperitoneum.  In the absence of recent intervention, CT abdomen pelvis is suggested for further evaluation.  No evidence of acute cardiopulmonary disease.  Critical Value/emergent results were called by telephone at the time of interpretation on 05/25/2012 at 2310  hours to [facility], who verbally acknowledged these results.   Original Report Authenticated By: Charline Bills, M.D.     Anti-infectives: Anti-infectives     Start     Dose/Rate Route Frequency Ordered Stop   05/26/12 0600   cefOXitin (MEFOXIN) 2 g in dextrose 5 % 50 mL IVPB  Status:  Discontinued        2 g 100 mL/hr over 30 Minutes Intravenous On call to O.R. 05/25/12 2330 05/26/12 0326   05/26/12 0600   ertapenem (INVANZ) 1 g in sodium chloride 0.9 % 50 mL IVPB        1 g 100 mL/hr over 30 Minutes Intravenous Every 24 hours 05/26/12 0326            Assessment/Plan: s/p Procedure(s) (LRB) with comments: EXPLORATORY LAPAROTOMY (N/A) - with resection of Jejunal polyp and enterolysis  Continue NPO, IV antibiotics, wound VAC  LOS: 2 days    Gabriel Lee A 05/27/2012

## 2012-05-28 LAB — CBC
MCV: 90.9 fL (ref 78.0–100.0)
Platelets: 133 10*3/uL — ABNORMAL LOW (ref 150–400)
RDW: 13.3 % (ref 11.5–15.5)
WBC: 12.8 10*3/uL — ABNORMAL HIGH (ref 4.0–10.5)

## 2012-05-28 LAB — BASIC METABOLIC PANEL
Chloride: 101 mEq/L (ref 96–112)
Creatinine, Ser: 0.87 mg/dL (ref 0.50–1.35)
GFR calc Af Amer: >90 mL/min (ref >90)
GFR calc non Af Amer: >90 mL/min (ref >90)
Potassium: 3.8 mEq/L (ref 3.5–5.1)

## 2012-05-28 NOTE — Progress Notes (Signed)
2 Days Post-Op  Subjective: POD#2 Comfortable when lying in bed.  Mostly having irritation of nose and throat.    Objective: Vital signs in last 24 hours: Temp:  [97.7 F (36.5 C)-99.2 F (37.3 C)] 98.6 F (37 C) (12/21 1009) Pulse Rate:  [98-122] 104  (12/21 1009) Resp:  [16-27] 20  (12/21 1009) BP: (119-140)/(72-94) 127/79 mmHg (12/21 1009) SpO2:  [95 %-99 %] 97 % (12/21 1009) Last BM Date: 05/25/12  Intake/Output from previous day: 12/20 0701 - 12/21 0700 In: 4715 [I.V.:4715] Out: 1910 [Urine:1310; Emesis/NG output:100; Drains:500] Intake/Output this shift:    Lungs clear Abdomen soft, VAC in place NG with dark bilious output  Lab Results:   [facility] 05/28/12 0440 05/27/12 1911  WBC 12.8* 13.4*  HGB 13.2 14.2  HCT 38.9* 41.1  PLT 133* 132*   BMET  Basename 05/28/12 0440 05/27/12 0600  NA 138 141  K 3.8 4.1  CL 101 107  CO2 32 26  GLUCOSE 104* 122*  BUN 18 21  CREATININE 0.87 1.10  CALCIUM 8.4 8.2*   PT/INR No results found for this basename: LABPROT:2,INR:2 in the last 72 hours ABG No results found for this basename: PHART:2,PCO2:2,PO2:2,HCO3:2 in the last 72 hours  Studies/Results: No results found.  Anti-infectives: Anti-infectives     Start     Dose/Rate Route Frequency Ordered Stop   05/26/12 0600   cefOXitin (MEFOXIN) 2 g in dextrose 5 % 50 mL IVPB  Status:  Discontinued        2 g 100 mL/hr over 30 Minutes Intravenous On call to O.R. 05/25/12 2330 05/26/12 0326   05/26/12 0600   ertapenem (INVANZ) 1 g in sodium chloride 0.9 % 50 mL IVPB        1 g 100 mL/hr over 30 Minutes Intravenous Every 24 hours 05/26/12 0326            Assessment/Plan: s/p Procedure(s) (LRB) with comments: EXPLORATORY LAPAROTOMY (N/A) - with resection of Jejunal polyp and enterolysis  Continue NPO, IV antibiotics, wound VAC NG output still rather dark but output low, anticipate it can come out tom Encourage ambulation Continue antibiotics for abd  contamination  LOS: 3 days    Athanasios Heldman C. 05/28/2012

## 2012-05-28 NOTE — Progress Notes (Signed)
When completing PCA checklist, noticed pt's respiratory rate is slightly elevated. Encouraged pt to take deep breaths, and not shallow ones, even though it hurts his abdomen. Encouraged use of incentive spirometer. Will continue to monitor.

## 2012-05-29 LAB — CBC
HCT: 35.6 % — ABNORMAL LOW (ref 39.0–52.0)
Hemoglobin: 12.4 g/dL — ABNORMAL LOW (ref 13.0–17.0)
MCV: 89.7 fL (ref 78.0–100.0)
RBC: 3.97 MIL/uL — ABNORMAL LOW (ref 4.22–5.81)
WBC: 14.3 10*3/uL — ABNORMAL HIGH (ref 4.0–10.5)

## 2012-05-29 MED ORDER — KETOROLAC TROMETHAMINE 30 MG/ML IJ SOLN
30.0000 mg | Freq: Four times a day (QID) | INTRAMUSCULAR | Status: DC | PRN
  Administered 2012-05-29 – 2012-05-30 (×5): 30 mg via INTRAVENOUS
  Filled 2012-05-29 (×6): qty 1

## 2012-05-29 MED ORDER — HYDROMORPHONE HCL PF 1 MG/ML IJ SOLN
1.0000 mg | INTRAMUSCULAR | Status: DC | PRN
  Administered 2012-05-30: 1 mg via INTRAVENOUS
  Filled 2012-05-29: qty 1

## 2012-05-29 NOTE — Progress Notes (Signed)
3 Days Post-Op  Subjective: Bowels moving.   Objective: Vital signs in last 24 hours: Temp:  [97.6 F (36.4 C)-98.6 F (37 C)] 97.6 F (36.4 C) (12/22 0523) Pulse Rate:  [96-123] 101  (12/22 0523) Resp:  [18-28] 21  (12/22 0530) BP: (122-136)/(79-90) 133/86 mmHg (12/22 0523) SpO2:  [94 %-97 %] 95 % (12/22 0530) Last BM Date: 05/25/12  Intake/Output from previous day: 12/21 0701 - 12/22 0700 In: 3460 [I.V.:3460] Out: 2660 [Urine:550; Emesis/NG output:2110] Intake/Output this shift:    Incision/Wound:vac in place  BS present.  Non distended  Lab Results:   Basename 05/28/12 0440 05/27/12 1911  WBC 12.8* 13.4*  HGB 13.2 14.2  HCT 38.9* 41.1  PLT 133* 132*   BMET  Basename 05/28/12 0440 05/27/12 0600  NA 138 141  K 3.8 4.1  CL 101 107  CO2 32 26  GLUCOSE 104* 122*  BUN 18 21  CREATININE 0.87 1.10  CALCIUM 8.4 8.2*   PT/INR No results found for this basename: LABPROT:2,INR:2 in the last 72 hours ABG No results found for this basename: PHART:2,PCO2:2,PO2:2,HCO3:2 in the last 72 hours  Studies/Results: No results found.  Anti-infectives: Anti-infectives     Start     Dose/Rate Route Frequency Ordered Stop   05/26/12 0600   cefOXitin (MEFOXIN) 2 g in dextrose 5 % 50 mL IVPB  Status:  Discontinued        2 g 100 mL/hr over 30 Minutes Intravenous On call to O.R. 05/25/12 2330 05/26/12 0326   05/26/12 0600   ertapenem (INVANZ) 1 g in sodium chloride 0.9 % 50 mL IVPB        1 g 100 mL/hr over 30 Minutes Intravenous Every 24 hours 05/26/12 0326            Assessment/Plan: s/p Procedure(s) (LRB) with comments: EXPLORATORY LAPAROTOMY (N/A) - with resection of Jejunal polyp and enterolysis D/C NGT Try clears D/C PCA Ambulate Cont wound vac  LOS: 4 days    Shontavia Mickel A. 05/29/2012

## 2012-05-30 ENCOUNTER — Inpatient Hospital Stay (HOSPITAL_COMMUNITY): Payer: Managed Care, Other (non HMO)

## 2012-05-30 ENCOUNTER — Encounter (HOSPITAL_COMMUNITY): Payer: Self-pay | Admitting: General Surgery

## 2012-05-30 DIAGNOSIS — K631 Perforation of intestine (nontraumatic): Secondary | ICD-10-CM | POA: Diagnosis present

## 2012-05-30 LAB — BASIC METABOLIC PANEL
BUN: 17 mg/dL (ref 6–23)
CO2: 26 mEq/L (ref 19–32)
Chloride: 101 mEq/L (ref 96–112)
Creatinine, Ser: 0.7 mg/dL (ref 0.50–1.35)
Glucose, Bld: 105 mg/dL — ABNORMAL HIGH (ref 70–99)
Potassium: 3.5 mEq/L (ref 3.5–5.1)

## 2012-05-30 LAB — URINE MICROSCOPIC-ADD ON

## 2012-05-30 LAB — URINALYSIS, ROUTINE W REFLEX MICROSCOPIC
Glucose, UA: NEGATIVE mg/dL
Nitrite: NEGATIVE
Specific Gravity, Urine: 1.03 (ref 1.005–1.030)
pH: 6 (ref 5.0–8.0)

## 2012-05-30 LAB — CBC
Hemoglobin: 12.3 g/dL — ABNORMAL LOW (ref 13.0–17.0)
MCH: 31.3 pg (ref 26.0–34.0)
RBC: 3.93 MIL/uL — ABNORMAL LOW (ref 4.22–5.81)
WBC: 10.8 10*3/uL — ABNORMAL HIGH (ref 4.0–10.5)

## 2012-05-30 MED ORDER — LOPERAMIDE HCL 2 MG PO CAPS
6.0000 mg | ORAL_CAPSULE | Freq: Two times a day (BID) | ORAL | Status: DC
  Administered 2012-05-30: 6 mg via ORAL
  Administered 2012-05-31: 4 mg via ORAL
  Filled 2012-05-30 (×4): qty 3

## 2012-05-30 MED ORDER — HYDROMORPHONE HCL PF 1 MG/ML IJ SOLN
0.5000 mg | INTRAMUSCULAR | Status: DC | PRN
  Administered 2012-06-01: 0.5 mg via INTRAVENOUS
  Filled 2012-05-30: qty 1

## 2012-05-30 MED ORDER — HYDROCODONE-ACETAMINOPHEN 7.5-500 MG/15ML PO SOLN
10.0000 mL | ORAL | Status: DC | PRN
  Administered 2012-05-31: 15 mL via ORAL
  Filled 2012-05-30: qty 15

## 2012-05-30 NOTE — Progress Notes (Signed)
Patient ID: Gabriel Lee, male   DOB: Nov 26, 1960, 51 y.o.   MRN: [id number]   LOS: 5 days   Subjective: Tolerated clears without N/V.   Objective: Vital signs in last 24 hours: Temp:  [98.4 F (36.9 C)-99.7 F (37.6 C)] 99.3 F (37.4 C) (12/23 0519) Pulse Rate:  [96-110] 100  (12/23 0519) Resp:  [18] 18  (12/23 0519) BP: (120-133)/(74-78) 123/77 mmHg (12/23 0519) SpO2:  [100 %] 100 % (12/23 0519) Last BM Date: 05/29/12   Lab Results:  CBC  Basename 05/30/12 0700 05/29/12 0821  WBC 10.8* 14.3*  HGB 12.3* 12.4*  HCT 35.6* 35.6*  PLT 154 138*   BMET  Basename 05/30/12 0700 05/28/12 0440  NA 135 138  K 3.5 3.8  CL 101 101  CO2 26 32  GLUCOSE 105* 104*  BUN 17 18  CREATININE 0.70 0.87  CALCIUM 8.4 8.4    General appearance: alert and no distress Resp: clear to auscultation bilaterally Cardio: regular rate and rhythm GI: Soft, +BS, VAC in place.   Assessment/Plan: SB perf s/p SBR -- Will give fulls. Orals for pain. Will start back loperamide at home dose.   Freeman Caldron, PA-C Pager: [phone]   05/30/2012

## 2012-05-30 NOTE — Consult Note (Signed)
WOC consult Note Reason for Consult: CCS PA in to assess wound during first post-op dressing change.   Wound type: Full thickness Measurement :21X1X.2cm Wound bed: beefy red Drainage (amount, consistency, odor) mod pink drainage, no odor Periwound: Intact skin surrounding. Dressing procedure/placement/frequency: Applied one piece black foam to cont suction at .  Pt tolerated well with mod discomfort and pain meds given prior to procedure.  Plan for bedside nurse to change Q M/W/F. Will not plan to follow further unless re-consulted.  [address], RN, MSN, [employer]  [phone]

## 2012-05-30 NOTE — Progress Notes (Signed)
Oral temp: 102.9.  Dr. Magnus Ivan notified and orders received.  Will continue to monitor.

## 2012-05-30 NOTE — Progress Notes (Signed)
Agree with A&P of MJ,PA. He feels he is definitely improving. May progress enough to go home tomorrow

## 2012-05-31 ENCOUNTER — Inpatient Hospital Stay (HOSPITAL_COMMUNITY): Payer: Managed Care, Other (non HMO)

## 2012-05-31 LAB — CBC
Hemoglobin: 11.6 g/dL — ABNORMAL LOW (ref 13.0–17.0)
MCH: 30.8 pg (ref 26.0–34.0)
RBC: 3.77 MIL/uL — ABNORMAL LOW (ref 4.22–5.81)

## 2012-05-31 MED ORDER — IOHEXOL 300 MG/ML  SOLN
80.0000 mL | Freq: Once | INTRAMUSCULAR | Status: AC | PRN
  Administered 2012-05-31: 80 mL via INTRAVENOUS

## 2012-05-31 MED ORDER — IOHEXOL 300 MG/ML  SOLN
20.0000 mL | INTRAMUSCULAR | Status: AC
  Administered 2012-05-31 (×2): 20 mL via ORAL

## 2012-05-31 MED ORDER — ACETAMINOPHEN 325 MG PO TABS
650.0000 mg | ORAL_TABLET | Freq: Four times a day (QID) | ORAL | Status: DC | PRN
  Administered 2012-05-31: 650 mg via ORAL
  Filled 2012-05-31: qty 2

## 2012-05-31 NOTE — Progress Notes (Signed)
[facility]  Patient Status: New  [facility] is providing the following services: RN - for [facility] wound care.   If patient discharges after hours, please call [phone].   Jodene Nam 05/31/2012, 3:59 PM

## 2012-05-31 NOTE — Progress Notes (Signed)
Patient ID: Gabriel Lee, male   DOB: 1961/04/14, 51 y.o.   MRN: [id number]   LOS: 6 days   Subjective: Tolerated clears without N/V. Ran low grade fever last pm 101.3 tmax. Will obtain CT with contrast today to r/o abscess formation as cause of fevers.  WBC have trended up slightly as well 13.9 from 10.8 previously on 05/30/12.  UA negative for infection.  Objective: Vital signs in last 24 hours: Temp:  [99.1 F (37.3 C)-101.8 F (38.8 C)] 99.5 F (37.5 C) (12/24 0515) Pulse Rate:  [100-112] 100  (12/24 0515) Resp:  [18] 18  (12/24 0515) BP: (119-128)/(66-76) 128/75 mmHg (12/24 0515) SpO2:  [97 %-100 %] 100 % (12/24 0515) Last BM Date: 05/31/12   Lab Results:  CBC  Basename 05/31/12 0916 05/30/12 0700  WBC 13.9* 10.8*  HGB 11.6* 12.3*  HCT 34.4* 35.6*  PLT 176 154   BMET  Basename 05/30/12 0700  NA 135  K 3.5  CL 101  CO2 26  GLUCOSE 105*  BUN 17  CREATININE 0.70  CALCIUM 8.4    General appearance: alert and no distress Resp: clear to auscultation bilaterally Cardio: regular rate and rhythm GI: Soft, +BS, VAC in place.   Assessment/Plan: SB perf s/p SBR  Continue with po pain meds Await CT results    Blenda Mounts [facility] Surgery Pager # [phone]  05/31/2012

## 2012-05-31 NOTE — Progress Notes (Signed)
Pt wife has requested that the pt not to be treated for fevers. She informed me that she is a Administrator, [employer] and would not want tylenol to be given to allow pt immune system to respond to infection. Ilean Skill LPN

## 2012-05-31 NOTE — Progress Notes (Signed)
Patient now s/p CT scan. He feels OK and is hoping to go home soon. His diet has been tolerated, no nausea, no significant abd pain. His abd is soft and not tender at this time. Wound vac is in place.  CT shows some residual air and fluid but no obvious abscess or worrisome finding. Will allow diet to advance and recheck cbc in am.

## 2012-05-31 NOTE — Progress Notes (Signed)
Md made aware of elevated temp, new order given and noted.

## 2012-06-01 ENCOUNTER — Inpatient Hospital Stay (HOSPITAL_COMMUNITY): Payer: Managed Care, Other (non HMO)

## 2012-06-01 ENCOUNTER — Encounter (HOSPITAL_COMMUNITY): Payer: Self-pay | Admitting: *Deleted

## 2012-06-01 ENCOUNTER — Encounter (HOSPITAL_COMMUNITY): Admission: EM | Disposition: A | Discharge status: Home / Self Care | Payer: Self-pay | Source: Home / Self Care

## 2012-06-01 LAB — URINALYSIS, ROUTINE W REFLEX MICROSCOPIC
Glucose, UA: NEGATIVE mg/dL
Leukocytes, UA: NEGATIVE
Nitrite: NEGATIVE
pH: 5.5 (ref 5.0–8.0)

## 2012-06-01 LAB — CBC WITH DIFFERENTIAL/PLATELET
Eosinophils Relative: 1 % (ref 0–5)
Lymphocytes Relative: 11 % — ABNORMAL LOW (ref 12–46)
MCV: 89.1 fL (ref 78.0–100.0)
Monocytes Relative: 8 % (ref 3–12)
Neutrophils Relative %: 80 % — ABNORMAL HIGH (ref 43–77)
Platelets: 210 10*3/uL (ref 150–400)
RBC: 2.48 MIL/uL — ABNORMAL LOW (ref 4.22–5.81)
WBC: 16.1 10*3/uL — ABNORMAL HIGH (ref 4.0–10.5)

## 2012-06-01 LAB — CBC
Hemoglobin: 6.8 g/dL — CL (ref 13.0–17.0)
RBC: 2.18 MIL/uL — ABNORMAL LOW (ref 4.22–5.81)
WBC: 18 10*3/uL — ABNORMAL HIGH (ref 4.0–10.5)

## 2012-06-01 LAB — PROTIME-INR
INR: 1.41 (ref 0.00–1.49)
Prothrombin Time: 16.9 seconds — ABNORMAL HIGH (ref 11.6–15.2)

## 2012-06-01 LAB — OCCULT BLOOD X 1 CARD TO LAB, STOOL: Fecal Occult Bld: POSITIVE — AB

## 2012-06-01 LAB — URINE MICROSCOPIC-ADD ON

## 2012-06-01 SURGERY — EGD (ESOPHAGOGASTRODUODENOSCOPY)
Anesthesia: Moderate Sedation

## 2012-06-01 MED ORDER — IOHEXOL 300 MG/ML  SOLN
450.0000 mL | Freq: Once | INTRAMUSCULAR | Status: AC | PRN
  Administered 2012-06-01: 450 mL

## 2012-06-01 MED ORDER — MIDAZOLAM HCL 5 MG/ML IJ SOLN
INTRAMUSCULAR | Status: AC
  Filled 2012-06-01: qty 2

## 2012-06-01 MED ORDER — FENTANYL CITRATE 0.05 MG/ML IJ SOLN
INTRAMUSCULAR | Status: AC
  Filled 2012-06-01: qty 2

## 2012-06-01 MED ORDER — SODIUM CHLORIDE 0.9 % IV SOLN
INTRAVENOUS | Status: DC
  Administered 2012-06-02 – 2012-06-03 (×2): via INTRAVENOUS

## 2012-06-01 MED ORDER — SODIUM CHLORIDE 0.9 % IV SOLN
Freq: Once | INTRAVENOUS | Status: AC
  Administered 2012-06-01: 500 mL via INTRAVENOUS

## 2012-06-01 MED ORDER — FENTANYL CITRATE 0.05 MG/ML IJ SOLN
INTRAMUSCULAR | Status: DC | PRN
  Administered 2012-06-01: 25 ug via INTRAVENOUS

## 2012-06-01 MED ORDER — SODIUM CHLORIDE 0.9 % IV SOLN: INTRAVENOUS | Status: DC

## 2012-06-01 MED ORDER — PANTOPRAZOLE SODIUM 40 MG IV SOLR
40.0000 mg | Freq: Two times a day (BID) | INTRAVENOUS | Status: DC
  Administered 2012-06-01: 40 mg via INTRAVENOUS
  Filled 2012-06-01: qty 40

## 2012-06-01 MED ORDER — BUTAMBEN-TETRACAINE-BENZOCAINE 2-2-14 % EX AERO
INHALATION_SPRAY | CUTANEOUS | Status: DC | PRN
  Administered 2012-06-01: 2 via TOPICAL

## 2012-06-01 MED ORDER — IOHEXOL 300 MG/ML  SOLN
80.0000 mL | Freq: Once | INTRAMUSCULAR | Status: AC | PRN
  Administered 2012-06-01: 80 mL via INTRAVENOUS

## 2012-06-01 MED ORDER — MIDAZOLAM HCL 10 MG/2ML IJ SOLN
INTRAMUSCULAR | Status: DC | PRN
  Administered 2012-06-01: 1.5 mg via INTRAVENOUS
  Administered 2012-06-01: 1 mg via INTRAVENOUS
  Administered 2012-06-01: 2 mg via INTRAVENOUS
  Administered 2012-06-01: .5 mg via INTRAVENOUS

## 2012-06-01 NOTE — Progress Notes (Signed)
Patient interviewed and examined with Hinda Glatter NP. Of concern is persistent fever and further white count elevation to 16,000. He does not have significant abdominal pain. Abdomen is mildly distended, soft and mildly tender. Also noted is drop in hemoglobin to 7.7. He is also tachycardic.  I reviewed his CT scan from yesterday which shows no obvious problems but he does have quite a bit of free fluid in the abdomen and also some free air which may be more than you would expect at this day postop.  I discussed with the patient and his girlfriend that I am concerned about an anastomotic leak in this setting. I recommended proceeding with a Gastrografin enema to evaluate the anastomosis which is just above his old ileocolonic anastomosis. Also check a urine. Chest x-ray showed slight left basilar atelectasis or infiltrate which I doubt explains his clinical picture.  I also recommended a transfusion of red cells with his precipitous drop in hemoglobin and tachycardia. However they would rather not have a transfusion at this time and we agreed to recheck his hemoglobin later today.

## 2012-06-01 NOTE — Consult Note (Signed)
Referring Provider: Dr. Harden Mo Primary Care Physician:  No primary provider on file. Primary Gastroenterologist:  None  Reason for Consultation:  GI bleed (melena)  HPI: Gabriel Lee is a 51 y.o. male approximately 5 years status post IPPA for severe colonic polyposis and a colon cancer, performed in [place], who was admitted to the hospital about a week ago because of a perforation of his J-pouch, necessitating emergent surgical exploration and repair.   He was recovering from that operation when, a couple of days ago, he began having dark stools and, with that, a 5 g drop in hemoglobin over the past 48 hours. This has been without frank hemodynamic instability.   He has not had any significant prodromal upper tract symptoms. No prior history of GI bleeding or ulcer disease. He has been receiving Toradol for pain since his surgery, also, he was on DVT prophylaxis with Lovenox. He was not on PPI prophylaxis.   His most recent hemoglobin was 6.8, down from 12.3 compared to 2 days ago. A BUN has not been checked during this interval. However, his stools have been melenic in character.     Past Medical History  Diagnosis Date  . Colon cancer     Past Surgical History  Procedure Date  . Colon surgery   . Laparotomy 05/26/2012    Procedure: EXPLORATORY LAPAROTOMY;  Surgeon: Cherylynn Ridges, MD;  Location: [facility] OR;  Service: General;  Laterality: N/A;  with resection of Jejunal polyp and enterolysis    Prior to Admission medications   Medication Sig Start Date End Date Taking? Authorizing Provider  loperamide (IMODIUM) 2 MG capsule Take 6 mg by mouth 2 (two) times daily. Slowing down of bowel movement   Yes Historical Provider, MD    Current Facility-Administered Medications  Medication Dose Route Frequency Provider Last Rate Last Dose  . 0.9 %  sodium chloride infusion   Intravenous Continuous Emelia Loron, MD      . [facility] HOLD] acetaminophen (TYLENOL) tablet 650 mg  650  mg Oral Q6H PRN Axel Filler, MD   650 mg at 05/31/12 1753  . [facility] HOLD] antiseptic oral rinse (BIOTENE) solution 15 mL  15 mL Mouth Rinse BID Freeman Caldron, PA   15 mL at 06/01/12 2141  . [MAR HOLD] ertapenem (INVANZ) 1 g in sodium chloride 0.9 % 50 mL IVPB  1 g Intravenous Q24H Cherylynn Ridges, MD   1 g at 06/01/12 0605  . [facility] HOLD] HYDROmorphone (DILAUDID) injection 0.5 mg  0.5 mg Intravenous Q4H PRN Freeman Caldron, PA   0.5 mg at 06/01/12 1449  . [MAR HOLD] pantoprazole (PROTONIX) injection 40 mg  40 mg Intravenous Q12H Emelia Loron, MD   40 mg at 06/01/12 2141  . [MAR HOLD] phenol (CHLORASEPTIC) mouth spray 1 spray  1 spray Mouth/Throat PRN Doristine Mango, PA-C        Allergies as of 05/25/2012  . (No Known Allergies)    History reviewed. No pertinent family history.  History   Social History  . Marital Status: Married    Spouse Name: N/A    Number of Children: N/A  . Years of Education: N/A   Occupational History  . Not on file.   Social History Main Topics  . Smoking status: Never Smoker   . Smokeless tobacco: Not on file  . Alcohol Use: Yes     Comment: occasionally  . Drug Use: No  . Sexually Active:    Other Topics Concern  .  Not on file   Social History Narrative  . No narrative on file    Review of Systems: Positive for: Chronic diarrhea for which she uses loperamide at home. No chronic dyspeptic symptomatology. No chronic upper tract symptoms. Physical Exam: Vital signs in last 24 hours: Temp:  [98.5 F (36.9 C)-100.7 F (38.2 C)] 98.7 F (37.1 C) (12/25 2140) Pulse Rate:  [114-127] 114  (12/25 2140) Resp:  [17-20] 17  (12/25 2140) BP: (113-132)/(67-81) 123/67 mmHg (12/25 2140) SpO2:  [97 %-99 %] 99 % (12/25 1515) Last BM Date: 06/01/12 General:   Alert,  Well-developed, well-nourished, pleasant and cooperative in NAD, but very pale Head:  Normocephalic and atraumatic. Eyes:  Sclera clear, no icterus.   Mouth:   No ulcerations or  lesions.  Oropharynx pink & moist. Lungs:  Clear throughout to auscultation.   No wheezes, crackles, or rhonchi. No evident respiratory distress. Heart:   Regular slightly rapid rate and rhythm; no murmurs, clicks, rubs,  or gallops. Abdomen:  Soft, nontender, and nondistended. No masses, hepatosplenomegaly or ventral hernias noted. He has a midline incision covered with black take and a wound VAC device is present. Msk:   Symmetrical without gross deformities. Pulses:  Normal pulses noted. Specifically, radial pulse is quite full. Extremities:   Without clubbing, cyanosis, or edema. Neurologic:  Alert and coherent;  grossly normal neurologically. Skin:  Intact without significant lesions or rashes, but very pale. Psych:   Alert and cooperative. Normal mood and affect.  Intake/Output from previous day: 12/24 0701 - 12/25 0700 In: 100 [IV Piggyback:100] Out: 25 [Drains:25] Intake/Output this shift:    Lab Results:  Basename 06/01/12 1550 06/01/12 0827 05/31/12 0916  WBC 18.0* 16.1* 13.9*  HGB 6.8* 7.7* 11.6*  HCT 19.3* 22.1* 34.4*  PLT 222 210 176   BMET  Basename 05/30/12 0700  NA 135  K 3.5  CL 101  CO2 26  GLUCOSE 105*  BUN 17  CREATININE 0.70  CALCIUM 8.4   LFT No results found for this basename: PROT,ALBUMIN,AST,ALT,ALKPHOS,BILITOT,BILIDIR,IBILI in the last 72 hours PT/INR  Basename 06/01/12 1750  LABPROT 16.9*  INR 1.41      Studies/Results: Ct Abdomen Pelvis W Contrast  06/01/2012  *RADIOLOGY REPORT*  Clinical Data: Decreased hematocrit over last 24 hours.  Rule out intra-abdominal bleeding.  History of colectomy.  History of perforated small bowel with surgical repair 6 days ago.  CT ABDOMEN AND PELVIS WITH CONTRAST  Technique:  Multidetector CT imaging of the abdomen and pelvis was performed following the standard protocol during bolus administration of intravenous contrast.  Contrast: 80mL OMNIPAQUE IOHEXOL 300 MG/ML  SOLN  Comparison: CT abdomen  05/31/2012  Findings: Small bilateral pleural effusions are unchanged.  Moderate intraperitoneal air is present under the diaphragm, similar amount to yesterday.  This could be due to recent laparotomy.  Perforated bowel could also cause this.  There is a mild amount of free fluid around the liver.  There is a moderate amount of free fluid in the abdomen.  This fluid is located anteriorly and in both colic gutters.  This appears to be simple fluid and not blood based on the density.  This is similar to yesterday.  No retroperitoneal hematoma is present.  Liver and spleen are normal.  Pancreas is normal.  No renal obstruction or mass.  Abdominal aorta is not aneurysmal.  Small bowel is mildly dilated with air-fluid levels.  This may reflect postop ileus.  Partial small bowel obstruction not excluded.  IMPRESSION:  Free intraperitoneal air is unchanged from yesterday and may be due to recent laparotomy.  Moderate amount of free fluid is unchanged from yesterday.  The patient has had recent surgery with peritonitis.  Density is most consistent with simple fluid and not blood.  Negative for retroperitoneal hemorrhage.  Mild small bowel dilatation is unchanged and may be due to postop ileus or partial obstruction.   Original Report Authenticated By: Janeece Riggers, M.D.    Ct Abdomen Pelvis W Contrast  05/31/2012  *RADIOLOGY REPORT*  Clinical Data: Small bowel perforation with small bowel resection, history:  Carcinoma, evaluate for abdominal abscess  CT ABDOMEN AND PELVIS WITH CONTRAST  Technique:  Multidetector CT imaging of the abdomen and pelvis was performed following the standard protocol during bolus administration of intravenous contrast.  Contrast: 80mL OMNIPAQUE IOHEXOL 300 MG/ML  SOLN  Comparison: Abdomen plain films of 05/25/2012  Findings: There are bilateral pleural effusions present with bibasilar atelectasis or pneumonia. There is free peritoneal air present which may to be due to the recent perforated  ileal - jejunal pouch and surgery.  The liver enhances with no focal abnormality and no ductal dilatation is seen.  No calcified gallstones are seen.  The pancreas is normal in size and the pancreatic duct is not dilated.  The adrenal glands and spleen are unremarkable.  The stomach is moderately fluid distended with food debris present as well.  No gross abnormality is evident.  The kidneys enhance with no calculus or mass and no hydronephrosis is seen.  Abdominal aorta is normal in caliber.  There is a moderate amount of peritoneal fluid present which may be due to the recent perforation and surgery as well as possible peritonitis.  A moderate amount of fluid also extends into the pelvis.  The urinary bladder is unremarkable.  The prostate is within normal limits in size.  There appears to be an anastomosis of small bowel with the rectum low in the right pelvis with no complicating features noted.  Slightly prompt loops of small bowel are present most likely due to ileus with no definite evidence of bowel obstruction.  No discrete abscess is evident.  The SI joints appear to be fused.  IMPRESSION:  1.  Free intraperitoneal air and peritoneal fluid most likely due to the recent perforation of the ileal -jejunal pouch and recent surgery.  Peritonitis cannot be excluded. 2.  No discrete abscess is seen. 3.  Small bilateral pleural effusions with mild bilateral lower lobe atelectasis and/or pneumonia. 4.  Probable ileus.  No definite obstruction. 5.  Apparent fusion of both SI joints.   Original Report Authenticated By: Dwyane Dee, M.D.    Dg Chest Port 1 View  05/31/2012  *RADIOLOGY REPORT*  Clinical Data: Fever  PORTABLE CHEST - 1 VIEW  Comparison: 05/30/2012  Findings: Cardiomediastinal silhouette is stable.  No pulmonary edema.  Streaky residual left basilar atelectasis or infiltrate with slight improvement in aeration.  IMPRESSION:  No pulmonary edema.  Streaky residual left basilar atelectasis or infiltrate  with slight improvement in aeration.   Original Report Authenticated By: Natasha Mead, M.D.     Impression: Recent, subacute GI bleed, moderately severe but not frankly destabilizing. Based on the dark character of the stool and the fact that the patient is status post colectomy, this is almost certainly of upper tract origin.  Plan: Endoscopic evaluation this evening. The nature, purpose, risks of the procedure were reviewed with the patient and he is agreeable to proceed. His significant other,  Bonita Quin, is a International aid/development worker who is acquainted with medical issues and she is very much in favor of proceeding as well. Questions were answered.   LOS: 7 days   Duran Ohern V  06/01/2012, 10:42 PM

## 2012-06-01 NOTE — Progress Notes (Signed)
Wrong time

## 2012-06-01 NOTE — Progress Notes (Signed)
CRITICAL VALUE ALERT  Critical value received:  Hgb  Date of notification:  06/01/12  Time of notification:  1620  Critical value read back:yes  Nurse who received alert:  cstolwyk RN  MD notified (1st page):  Dr. Dwain Sarna  Time of first page:  1630  MD notified (2nd page):  Time of second page:  Responding MD:  Dr. Dwain Sarna  Time MD responded: 1640

## 2012-06-01 NOTE — Progress Notes (Signed)
MD on call notified of pt last set of vitals 132/77 125 no new orders pt is asymptomatic will inform on coming RN. Ilean Skill LPN

## 2012-06-01 NOTE — Progress Notes (Signed)
Patient ID: Gabriel Lee, male   DOB: Mar 05, 1961, 51 y.o.   MRN: [id number] Ct ab/pelvis shows some free air postop (I am not entirely sure this is completely expected but does not have leak on enema earlier and abdominal exam not c/w leak) and fluid but this is not really changed from yesterday and does not appear to be blood.  I think he is losing blood from gi tract and this is likely upper source and not near his resection/anastomosis given the tarry stool I saw.  We have stopped his lovenox and started protonix, I have made npo, he is currently receiving blood as well, I have consulted Dr. Matthias Hughs for evaluation.

## 2012-06-01 NOTE — Progress Notes (Signed)
See my earlier note today

## 2012-06-01 NOTE — Progress Notes (Signed)
Patient ID: Gabriel Lee, male   DOB: [birth year], 51 y.o.   MRN: [id number]   LOS: 7 days   Subjective: Tolerated clears without N/V. Ran low grade fever last pm 102.1 tmax.last evening. Will obtain water soluble enema today to r/o anastomotic leak in face of decreasing Hgb.  WBC have trended up as well from 16.1 from 13.9 previously on 05/31/12. H&H down from 11.6 to 7.4 today; recheck scheduled for 1500 hrs today. (patient's wife wishes to hold off on transfusion until re-check) Chest Xray done 12/24: IMPRESSION:  No pulmonary edema. Streaky residual left basilar atelectasis or  infiltrate with slight improvement in aeration. CT of abdomen pelvis done 05/31/12: IMPRESSION:  1. Free intraperitoneal air and peritoneal fluid most likely due  to the recent perforation of the ileal -jejunal pouch and recent  surgery. Peritonitis cannot be excluded.  2. No discrete abscess is seen.  3. Small bilateral pleural effusions with mild bilateral lower  lobe atelectasis and/or pneumonia.  4. Probable ileus. No definite obstruction.  5. Apparent fusion of both SI joints.   Objective: Vital signs in last 24 hours: Temp:  [97.6 F (36.4 C)-102.9 F (39.4 C)] 99.6 F (37.6 C) (12/25 0820) Pulse Rate:  [118-125] 124  (12/25 0820) Resp:  [17-18] 17  (12/25 0820) BP: (125-141)/(75-88) 125/81 mmHg (12/25 0820) SpO2:  [96 %-100 %] 98 % (12/25 0820) Last BM Date: 05/31/12   Lab Results:  CBC  Basename 06/01/12 0827 05/31/12 0916  WBC 16.1* 13.9*  HGB 7.7* 11.6*  HCT 22.1* 34.4*  PLT 210 176   BMET  Basename 05/30/12 0700  NA 135  K 3.5  CL 101  CO2 26  GLUCOSE 105*  BUN 17  CREATININE 0.70  CALCIUM 8.4    General appearance: NAD, remains tachycardic at rest. Color is good. Chest: fine crackles at left base, otherwise clear. Cardiac: Tachycardic, No M/R/G Abdomen: distended, soft, non tender, wound vac in place. Patient reports"black" stools (will send off for hemoccult.  GU: no  urgency or frequency or discomfort with urination; previous UA was negative. + CVT on exam on right.   Assessment/Plan:  Patient Active Problem List  Diagnosis  . Perforated intestine  SB perf s/p SBR  ? Source of recurrent fevers. Plan: Continue with po pain meds Await CT results  Hemoccult stool Recheck CBC today and in am (likely will need transfusion) Repeat UA Await BC results (No growth to date). Encourage IS     Blenda Mounts [facility] Surgery Pager # [phone]  06/01/2012

## 2012-06-01 NOTE — Progress Notes (Signed)
Patient ID: Gabriel Lee, male   DOB: [birth year], 51 y.o.   MRN: [id number] hct decreased, remains tachycardic, I recommended one unit prbcs. I will also stop lovenox.  Will check pt/inr with next blood draw, platelets are normal. Will make npo Most likely source is intraabdominal but does have some dark stool (no brb) with positive hemoccult.  If this was anastomotic bleed I would expect with volume this to be brb.  Even if this was a gastric or duodenal ulcer I would expect brb given amount. We discussed at length plan to reassess abdomen with ct scan to see if there is more fluid.  If there is will be decision on going back to or but if ok would recheck and wait for lovenox to be gone.  If negative will consider ng with lavage and gi consult as well for possible endoscopy.  I will also start on protonix now.

## 2012-06-02 LAB — CBC
HCT: 19.3 % — ABNORMAL LOW (ref 39.0–52.0)
Hemoglobin: 6.8 g/dL — CL (ref 13.0–17.0)
MCH: 30.7 pg (ref 26.0–34.0)
MCH: 31.2 pg (ref 26.0–34.0)
MCHC: 35.4 g/dL (ref 30.0–36.0)
MCHC: 35.2 g/dL (ref 30.0–36.0)
MCV: 88.5 fL (ref 78.0–100.0)
MCV: 89.2 fL (ref 78.0–100.0)
Platelets: 247 10*3/uL (ref 150–400)
Platelets: 347 10*3/uL (ref 150–400)
RBC: 2.28 MIL/uL — ABNORMAL LOW (ref 4.22–5.81)
RDW: 13.9 % (ref 11.5–15.5)
WBC: 22.6 10*3/uL — ABNORMAL HIGH (ref 4.0–10.5)

## 2012-06-02 LAB — BASIC METABOLIC PANEL
Chloride: 103 mEq/L (ref 96–112)
Creatinine, Ser: 0.71 mg/dL (ref 0.50–1.35)
GFR calc Af Amer: >90 mL/min (ref >90)
Potassium: 3.8 mEq/L (ref 3.5–5.1)
Sodium: 136 mEq/L (ref 135–145)

## 2012-06-02 LAB — PREPARE RBC (CROSSMATCH)

## 2012-06-02 LAB — HEMOGLOBIN AND HEMATOCRIT, BLOOD: Hemoglobin: 6.5 g/dL — CL (ref 13.0–17.0)

## 2012-06-02 MED ORDER — SODIUM CHLORIDE 0.9 % IV SOLN
80.0000 mg | Freq: Once | INTRAVENOUS | Status: AC
  Administered 2012-06-02: 80 mg via INTRAVENOUS
  Filled 2012-06-02: qty 80

## 2012-06-02 MED ORDER — SUCRALFATE 1 G PO TABS
1.0000 g | ORAL_TABLET | Freq: Four times a day (QID) | ORAL | Status: DC
  Administered 2012-06-02 – 2012-06-03 (×6): 1 g via ORAL
  Filled 2012-06-02 (×9): qty 1

## 2012-06-02 MED ORDER — SODIUM CHLORIDE 0.9 % IV SOLN
8.0000 mg/h | INTRAVENOUS | Status: DC
  Administered 2012-06-02 – 2012-06-03 (×3): 8 mg/h via INTRAVENOUS
  Filled 2012-06-02 (×8): qty 80

## 2012-06-02 NOTE — Op Note (Signed)
[facility] [address] [place], [address]   ENDOSCOPY PROCEDURE REPORT  PATIENT: Gabriel Lee, Gabriel Lee  MR#: [id number] BIRTHDATE: January 09, 1961 , 51  yrs. old GENDER: Male ENDOSCOPIST:Ali Mclaurin, MD REFERRED BY:  Dr. Harden Mo PROCEDURE DATE:  06/01/2012 PROCEDURE:    upper endoscopy with control of hemorrhage (clipping)  ASA CLASS: INDICATIONS:   melenic stool and 6 g drop in hemoglobin over the past 48 hours, in a patient 1 week status post emergency surgery for a perforated J-pouch, who has been on Toradol and prophylactic Lovenox MEDICATION:    fentanyl 25 mcg, Versed 5 mg IV TOPICAL ANESTHETIC:    Cetacaine  DESCRIPTION OF PROCEDURE:   the patient was brought from his hospital room to the [facility] cone endoscopy unit and, after explanation of the risks and benefits of the procedure, he provided written consent.  He was given the above sedative medication, which was well tolerated, and the patient remained stable throughout the procedure.  The Pentax adult video endoscope was passed under direct vision. The vocal cords were not well seen but the esophagus was readily entered.  The esophagus was normal in its entirety except for a widely patent Schatzki's ring at the squamocolumnar junction, with minimal incursion on the esophageal lumen. Below this was a minimal, 1-2 cm hiatal hernia. Specifically, there was no evidence of Mallory-Weiss tear, reflux esophagitis, Barrett's esophagus, varices, infection, or neoplasia.  The stomach was entered. It contained a moderate amount of dark brown "motor oil" fluid, some of which was able to be suctioned out. There was also a small amount of old, burgundy clot present. However, there was no fresh blood seen.  There was some patchy erythema on the greater curve aspect of the fundus of the stomach.  The main finding on this exam, however, was a fairly large (3 x 6 cm), deep, benign-appearing ulcer  with an adherent clot, located on the proximal lesser curve of the stomach, just a short distance below the GE junction. There was no bleeding from the ulcer at this time, despite obvious evidence of recent bleeding in terms of the adherent clot. At the end of the procedure, a clip was successfully placed at the base of the clot.  Interestingly, the remainder of the stomach was normal, without any erosive changes or ulcers in the antrum.  The pylorus and duodenal bulb looked normal. However, the proximal second portion of the duodenum had some superficial erosions, consistent with erosive duodenitis.  Random biopsies from the antrum of the stomach were obtained for histologic analysis, to look for evidence of H. pylori infection, which could conceivably be contributory to the observed gastric ulcer.  The scope was then removed from the patient, who tolerated the procedure well.     COMPLICATIONS: None  ENDOSCOPIC IMPRESSION:  1. No active bleeding at the time of this exam. 2. Old blood present in stomach. 3. Likely source of bleeding is a large, deep ulcer on the proximal lesser curve of the stomach, with stigmata of hemorrhage (adherent clot). Endo Clip successfully placed at the base of the clot. 4. Mild erythematous gastritis. 5. Mild erosive duodenitis.  RECOMMENDATIONS:  1. Intensive antipeptic therapy. Change to Protonix infusion, add sucralfate. 2. Await pathology on today's biopsies and treat H. pylori if present. 3. In the event of a significant rebleed, the patient would be a candidate for another trial of endoscopic hemostasis, but if that were not successful, this patient would probably be a good candidate for  interventional radiology embolization.   _______________________________ Rosalie DoctorBernette Redbird, MD 06/02/2012 12:10 AM    PATIENT NAME:  Gabriel Lee, Gabriel Lee MR#: [id number]

## 2012-06-02 NOTE — Progress Notes (Signed)
His hgb has drifted to 6.5. He has still a bit of tachycardia, 108, but BP stable. He feels OK, says stools have turned brown.  I have again recommended transfusion as I am concerned about the HGB drift. They still decline. He is taking PO's so will slow IV and recheck HGB in a few hours. They wish to go home. I told them I do not think it safe to do so, both due to the potential of rebleed and the ongoing concern about his wbc elevated

## 2012-06-02 NOTE — Progress Notes (Signed)
1 Day Post-Op   Assessment: s/p Procedure(s): ESOPHAGOGASTRODUODENOSCOPY (EGD) Patient Active Problem List  Diagnosis  . Perforated intestine    Marked anemia persists, Hgb 6.8 today.  Abd seems soft and benign Still mild tachycardia - pulse 110 when I took this am  Plan: I have recommended transfusion as hgb ,7.0 and large ulcer with potential for significant rebleed. After discussion with patient and wife, they decline and wish to have hgb monitored. Will recheck at about noon. Will continue other meds. Now on protonix, off Toradol and lovenox  Subjective: Feels a bit better than yesterday, no nausea continues to tolerate diet, still dark stools  Objective: Vital signs in last 24 hours: Temp:  [97.4 F (36.3 C)-99.5 F (37.5 C)] 99 F (37.2 C) (12/26 0516) Pulse Rate:  [101-127] 101  (12/26 0516) Resp:  [9-80] 18  (12/26 0516) BP: (96-128)/(49-80) 119/74 mmHg (12/26 0516) SpO2:  [97 %-100 %] 98 % (12/26 0516)   Intake/Output from previous day: 12/25 0701 - 12/26 0700 In: 1118 [P.O.:250; I.V.:518; Blood:350] Out: 353 [Urine:351; Stool:2] Intake/Output this shift: Total I/O In: 240 [P.O.:240] Out: 350 [Urine:350]   General appearance: alert, cooperative, no distress, pale and less pale than yesterday GI: Soft, doesn't seem particularly tender.  Incision: wound vac  Lab Results:   [facility] 06/02/12 0655 06/02/12 0033  WBC 18.3* 17.5*  HGB 6.8* 7.0*  HCT 19.3* 19.8*  PLT 260 247   BMET  Basename 06/02/12 0655  NA 136  K 3.8  CL 103  CO2 26  GLUCOSE 103*  BUN 22  CREATININE 0.71  CALCIUM 7.5*   PT/INR  Basename 06/01/12 1750  LABPROT 16.9*  INR 1.41   ABG No results found for this basename: PHART:2,PCO2:2,PO2:2,HCO3:2 in the last 72 hours  MEDS, Scheduled    . antiseptic oral rinse  15 mL Mouth Rinse BID  . ertapenem (INVANZ) IV  1 g Intravenous Q24H  . sucralfate  1 g Oral QID    Studies/Results: Ct Abdomen Pelvis W  Contrast  06/01/2012  *RADIOLOGY REPORT*  Clinical Data: Decreased hematocrit over last 24 hours.  Rule out intra-abdominal bleeding.  History of colectomy.  History of perforated small bowel with surgical repair 6 days ago.  CT ABDOMEN AND PELVIS WITH CONTRAST  Technique:  Multidetector CT imaging of the abdomen and pelvis was performed following the standard protocol during bolus administration of intravenous contrast.  Contrast: 80mL OMNIPAQUE IOHEXOL 300 MG/ML  SOLN  Comparison: CT abdomen 05/31/2012  Findings: Small bilateral pleural effusions are unchanged.  Moderate intraperitoneal air is present under the diaphragm, similar amount to yesterday.  This could be due to recent laparotomy.  Perforated bowel could also cause this.  There is a mild amount of free fluid around the liver.  There is a moderate amount of free fluid in the abdomen.  This fluid is located anteriorly and in both colic gutters.  This appears to be simple fluid and not blood based on the density.  This is similar to yesterday.  No retroperitoneal hematoma is present.  Liver and spleen are normal.  Pancreas is normal.  No renal obstruction or mass.  Abdominal aorta is not aneurysmal.  Small bowel is mildly dilated with air-fluid levels.  This may reflect postop ileus.  Partial small bowel obstruction not excluded.  IMPRESSION: Free intraperitoneal air is unchanged from yesterday and may be due to recent laparotomy.  Moderate amount of free fluid is unchanged from yesterday.  The patient has had recent surgery  with peritonitis.  Density is most consistent with simple fluid and not blood.  Negative for retroperitoneal hemorrhage.  Mild small bowel dilatation is unchanged and may be due to postop ileus or partial obstruction.   Original Report Authenticated By: Janeece Riggers, M.D.    Ct Abdomen Pelvis W Contrast  05/31/2012  *RADIOLOGY REPORT*  Clinical Data: Small bowel perforation with small bowel resection, history:  Carcinoma, evaluate  for abdominal abscess  CT ABDOMEN AND PELVIS WITH CONTRAST  Technique:  Multidetector CT imaging of the abdomen and pelvis was performed following the standard protocol during bolus administration of intravenous contrast.  Contrast: 80mL OMNIPAQUE IOHEXOL 300 MG/ML  SOLN  Comparison: Abdomen plain films of 05/25/2012  Findings: There are bilateral pleural effusions present with bibasilar atelectasis or pneumonia. There is free peritoneal air present which may to be due to the recent perforated ileal - jejunal pouch and surgery.  The liver enhances with no focal abnormality and no ductal dilatation is seen.  No calcified gallstones are seen.  The pancreas is normal in size and the pancreatic duct is not dilated.  The adrenal glands and spleen are unremarkable.  The stomach is moderately fluid distended with food debris present as well.  No gross abnormality is evident.  The kidneys enhance with no calculus or mass and no hydronephrosis is seen.  Abdominal aorta is normal in caliber.  There is a moderate amount of peritoneal fluid present which may be due to the recent perforation and surgery as well as possible peritonitis.  A moderate amount of fluid also extends into the pelvis.  The urinary bladder is unremarkable.  The prostate is within normal limits in size.  There appears to be an anastomosis of small bowel with the rectum low in the right pelvis with no complicating features noted.  Slightly prompt loops of small bowel are present most likely due to ileus with no definite evidence of bowel obstruction.  No discrete abscess is evident.  The SI joints appear to be fused.  IMPRESSION:  1.  Free intraperitoneal air and peritoneal fluid most likely due to the recent perforation of the ileal -jejunal pouch and recent surgery.  Peritonitis cannot be excluded. 2.  No discrete abscess is seen. 3.  Small bilateral pleural effusions with mild bilateral lower lobe atelectasis and/or pneumonia. 4.  Probable ileus.  No  definite obstruction. 5.  Apparent fusion of both SI joints.   Original Report Authenticated By: Dwyane Dee, M.D.    Dg Chest Port 1 View  05/31/2012  *RADIOLOGY REPORT*  Clinical Data: Fever  PORTABLE CHEST - 1 VIEW  Comparison: 05/30/2012  Findings: Cardiomediastinal silhouette is stable.  No pulmonary edema.  Streaky residual left basilar atelectasis or infiltrate with slight improvement in aeration.  IMPRESSION:  No pulmonary edema.  Streaky residual left basilar atelectasis or infiltrate with slight improvement in aeration.   Original Report Authenticated By: Natasha Mead, M.D.    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 Water soluble contrast was introduced into the colon in a retrograde fashion and refluxed from the rectum to the cecum.  Spot images of the colon followed by overhead radiographs were obtained.  Fluoroscopy time: 2.4 minutes.  Comparison:  CT  abdomen pelvis of 05/31/2012  Findings:  A preliminary film of the abdomen shows no bowel obstruction.  Surgical sutures and clips are noted in the left pelvis.  Water soluble contrast entered the short segment of remaining rectum with no obstruction.  There is passage of water soluble contrast into the distal ileum with no evidence of an anastomotic leak.  Contrast was taken into the distal small bowel as much as the patient could tolerate.  No evidence of extravasation is seen. A postevacuation film also shows no evidence of extravasation.  IMPRESSION: No evidence of an anastomotic leak.  I spoke with Dr. Johna Sheriff on 06/01/2012 at 12:52 p.m. concerning the results of this study.   Original Report Authenticated By: Dwyane Dee, M.D.       LOS: 8 days      Currie Paris, MD, [facility], [place] [phone]   06/02/2012 9:24 AM

## 2012-06-02 NOTE — Progress Notes (Signed)
Hemoglobin stable since last night's procedure. BUN normal at 22. Therefore suspect no further bleeding.  Recommendation: Continue Protonix infusion. The patient and his significant other want him out of the hospital as soon as possible because of fear of nosocomial infections. She also wants him off the PPI as soon as possible for fear of complications from those medicines. I suggested to her that the greater risk is probably inadequately treated ulcer disease and therefore I would favor maximal medical therapy (that is, Protonix infusion) for as long as he is in the hospital, and at least 2 months of therapy to allow time for his large ulcer to heal, so that he does not have to potentially undergo 2 endoscopies to confirm healing.  Florencia Reasons, M.D. [phone]

## 2012-06-02 NOTE — Progress Notes (Signed)
Brief Nutrition Note:  Pt answered "No" to unintentional weight loss. "Unsure" was then indicated for amount of weight lost. This field should have been left blank generating a score of 0.  No weight hx on file.  Body mass index is 28.41 kg/(m^2).  Overweight Current diet is CL   Chart reviewed, no nutrition interventions warranted at this time, please consult as needed.   Clarene Duke RD, LDN Pager [phone] After Hours pager [phone]

## 2012-06-02 NOTE — Progress Notes (Signed)
CRITICAL VALUE ALERT  Critical value received: Hgb 6.8  Date of notification: 12.26.13  Time of notification: 0730  Critical value read back:yes  Nurse who received alert: L. Araceli Bouche, RN  MD notified (1st page): Blenda Mounts, NP  Time of first page: [phone]  MD notified (2nd page):  Time of second page:  Responding MD: Blenda Mounts, NP  Time MD responded: [phone]

## 2012-06-02 NOTE — Progress Notes (Signed)
The patient tolerated his endoscopy very well.  The endoscopy showed a large gastric ulcer with an adherent clot in the proximal stomach, but no active bleeding.  Please see dictated procedure report with associated photographs, as well as orders. I am changing the patient to a Protonix infusion, and adding sucralfate to his regimen for the time being.  If the patient rebleeds, he will be a candidate for a repeat trial of endoscopic hemostasis unless he were very unstable. If endoscopic interventions were not effective, he would probably be a candidate for interventional radiology embolization.  We will follow the patient with you.  Florencia Reasons, M.D. [phone]

## 2012-06-02 NOTE — Progress Notes (Signed)
Notified Dr. Lindie Spruce of pt family request to add reticulocyte count to CBC. Received new order for this.

## 2012-06-03 ENCOUNTER — Inpatient Hospital Stay (HOSPITAL_COMMUNITY): Payer: Managed Care, Other (non HMO)

## 2012-06-03 ENCOUNTER — Encounter (HOSPITAL_COMMUNITY): Payer: Self-pay | Admitting: Radiology

## 2012-06-03 DIAGNOSIS — D509 Iron deficiency anemia, unspecified: Secondary | ICD-10-CM

## 2012-06-03 DIAGNOSIS — K651 Peritoneal abscess: Secondary | ICD-10-CM

## 2012-06-03 DIAGNOSIS — K922 Gastrointestinal hemorrhage, unspecified: Secondary | ICD-10-CM

## 2012-06-03 LAB — COMPREHENSIVE METABOLIC PANEL
ALT: 29 U/L (ref 0–53)
Albumin: 1.8 g/dL — ABNORMAL LOW (ref 3.5–5.2)
Alkaline Phosphatase: 67 U/L (ref 39–117)
Glucose, Bld: 102 mg/dL — ABNORMAL HIGH (ref 70–99)
Potassium: 3.9 mEq/L (ref 3.5–5.1)
Sodium: 134 mEq/L — ABNORMAL LOW (ref 135–145)
Total Protein: 5.3 g/dL — ABNORMAL LOW (ref 6.0–8.3)

## 2012-06-03 LAB — CBC
Hemoglobin: 6.1 g/dL — CL (ref 13.0–17.0)
Hemoglobin: 6.2 g/dL — CL (ref 13.0–17.0)
MCH: 30.5 pg (ref 26.0–34.0)
MCHC: 33.3 g/dL (ref 30.0–36.0)
MCV: 90.1 fL (ref 78.0–100.0)
RBC: 2.03 MIL/uL — ABNORMAL LOW (ref 4.22–5.81)
RDW: 13.8 % (ref 11.5–15.5)

## 2012-06-03 LAB — BASIC METABOLIC PANEL
CO2: 25 mEq/L (ref 19–32)
Glucose, Bld: 98 mg/dL (ref 70–99)
Potassium: 3.9 mEq/L (ref 3.5–5.1)
Sodium: 135 mEq/L (ref 135–145)

## 2012-06-03 MED ORDER — MIDAZOLAM HCL 2 MG/2ML IJ SOLN
INTRAMUSCULAR | Status: AC | PRN
  Administered 2012-06-03: 0.5 mg via INTRAVENOUS
  Administered 2012-06-03: 1 mg via INTRAVENOUS
  Administered 2012-06-03: 0.5 mg via INTRAVENOUS

## 2012-06-03 MED ORDER — AMOXICILLIN-POT CLAVULANATE 875-125 MG PO TABS: 1.0000 | ORAL_TABLET | Freq: Two times a day (BID) | ORAL | Status: DC

## 2012-06-03 MED ORDER — MIDAZOLAM HCL 2 MG/2ML IJ SOLN
INTRAMUSCULAR | Status: AC
  Filled 2012-06-03: qty 4

## 2012-06-03 MED ORDER — PANTOPRAZOLE SODIUM 40 MG PO TBEC: 40.0000 mg | DELAYED_RELEASE_TABLET | Freq: Two times a day (BID) | ORAL | Status: DC

## 2012-06-03 MED ORDER — FENTANYL CITRATE 0.05 MG/ML IJ SOLN
INTRAMUSCULAR | Status: AC | PRN
  Administered 2012-06-03: 25 ug via INTRAVENOUS
  Administered 2012-06-03: 50 ug via INTRAVENOUS
  Administered 2012-06-03: 25 ug via INTRAVENOUS

## 2012-06-03 MED ORDER — SUCRALFATE 1 G PO TABS: 1.0000 g | ORAL_TABLET | Freq: Four times a day (QID) | ORAL | Status: DC

## 2012-06-03 MED ORDER — FENTANYL CITRATE 0.05 MG/ML IJ SOLN
INTRAMUSCULAR | Status: AC
  Filled 2012-06-03: qty 4

## 2012-06-03 MED ORDER — HYDROMORPHONE HCL 2 MG PO TABS: 2.0000 mg | ORAL_TABLET | ORAL | Status: DC | PRN

## 2012-06-03 NOTE — Progress Notes (Signed)
Perforated intestine  Assessment: Perforated intestine Stable today, no evidence of bleeding, seen by GI, ID, Heme/onc, and IR with placement of abd drain. The drain is clear, so fluid does not appear grossly infected. His VS have been stable, he is relatively pain free and he and wife are adamant that they wish to go home today. IR recommended Augmentin for post op antibiotic and following wbc. He has wound care in place and knows management of the wound drain  Plan: Will discharge per patient request. Will arrange f/u thru our office with Dr Lindie Spruce.   Subjective: Feels better this PM and wants to go home  Objective: Vital signs in last 24 hours: Temp:  [98.1 F (36.7 C)-99 F (37.2 C)] 98.1 F (36.7 C) (12/27 1543) Pulse Rate:  [98-119] 107  (12/27 1705) Resp:  [14-20] 16  (12/27 1705) BP: (92-120)/(43-71) 116/59 mmHg (12/27 1705) SpO2:  [92 %-100 %] 97 % (12/27 1705) Last BM Date: 06/02/12  Intake/Output from previous day: 12/26 0701 - 12/27 0700 In: 2021.4 [P.O.:480; I.V.:1541.4] Out: 977 [Urine:950; Drains:25; Stool:2] Intake/Output this shift:    General appearance: alert, cooperative, no distress and pale GI: soft, non-tender; bowel sounds normal; no masses,  no organomegaly Incision/Wound: clean, VAC just changed  Lab Results:  Results for orders placed during the hospital encounter of 05/25/12 (from the past 24 hour(s))  CBC     Status: Abnormal   Collection Time   06/02/12  8:11 PM      Component Value Range   WBC 22.6 (*) 4.0 - 10.5 K/uL   RBC 2.31 (*) 4.22 - 5.81 MIL/uL   Hemoglobin 7.2 (*) 13.0 - 17.0 g/dL   HCT 14.7 (*) 82.9 - 56.2 %   MCV 89.2  78.0 - 100.0 fL   MCH 31.2  26.0 - 34.0 pg   MCHC 35.0  30.0 - 36.0 g/dL   RDW 13.0  86.5 - 78.4 %   Platelets 347  150 - 400 K/uL  RETICULOCYTES     Status: Abnormal   Collection Time   06/02/12  8:11 PM      Component Value Range   Retic Ct Pct 3.9 (*) 0.4 - 3.1 %   RBC. 2.31 (*) 4.22 - 5.81 MIL/uL   Retic Count, Manual 90.1  19.0 - 186.0 K/uL  CBC     Status: Abnormal   Collection Time   06/03/12  7:40 AM      Component Value Range   WBC 17.5 (*) 4.0 - 10.5 K/uL   RBC 2.04 (*) 4.22 - 5.81 MIL/uL   Hemoglobin 6.1 (*) 13.0 - 17.0 g/dL   HCT 69.6 (*) 29.5 - 28.4 %   MCV 89.7  78.0 - 100.0 fL   MCH 29.9  26.0 - 34.0 pg   MCHC 33.3  30.0 - 36.0 g/dL   RDW 13.2  44.0 - 10.2 %   Platelets 365  150 - 400 K/uL  COMPREHENSIVE METABOLIC PANEL     Status: Abnormal   Collection Time   06/03/12  7:40 AM      Component Value Range   Sodium 134 (*) 135 - 145 mEq/L   Potassium 3.9  3.5 - 5.1 mEq/L   Chloride 102  96 - 112 mEq/L   CO2 25  19 - 32 mEq/L   Glucose, Bld 102 (*) 70 - 99 mg/dL   BUN 13  6 - 23 mg/dL   Creatinine, Ser 7.25  0.50 - 1.35 mg/dL  Calcium 7.7 (*) 8.4 - 10.5 mg/dL   Total Protein 5.3 (*) 6.0 - 8.3 g/dL   Albumin 1.8 (*) 3.5 - 5.2 g/dL   AST 29  0 - 37 U/L   ALT 29  0 - 53 U/L   Alkaline Phosphatase 67  39 - 117 U/L   Total Bilirubin 0.3  0.3 - 1.2 mg/dL   GFR calc non Af Amer >90  >90 mL/min   GFR calc Af Amer >90  >90 mL/min  CBC     Status: Abnormal   Collection Time   06/03/12 10:35 AM      Component Value Range   WBC 19.6 (*) 4.0 - 10.5 K/uL   RBC 2.03 (*) 4.22 - 5.81 MIL/uL   Hemoglobin 6.2 (*) 13.0 - 17.0 g/dL   HCT 96.0 (*) 45.4 - 09.8 %   MCV 90.1  78.0 - 100.0 fL   MCH 30.5  26.0 - 34.0 pg   MCHC 33.9  30.0 - 36.0 g/dL   RDW 11.9  14.7 - 82.9 %   Platelets 389  150 - 400 K/uL  BASIC METABOLIC PANEL     Status: Abnormal   Collection Time   06/03/12 12:25 PM      Component Value Range   Sodium 135  135 - 145 mEq/L   Potassium 3.9  3.5 - 5.1 mEq/L   Chloride 102  96 - 112 mEq/L   CO2 25  19 - 32 mEq/L   Glucose, Bld 98  70 - 99 mg/dL   BUN 12  6 - 23 mg/dL   Creatinine, Ser 5.62  0.50 - 1.35 mg/dL   Calcium 7.7 (*) 8.4 - 10.5 mg/dL   GFR calc non Af Amer >90  >90 mL/min   GFR calc Af Amer >90  >90 mL/min      Studies/Results Radiology     MEDS, Scheduled    . antiseptic oral rinse  15 mL Mouth Rinse BID  . ertapenem (INVANZ) IV  1 g Intravenous Q24H  . fentaNYL      . midazolam      . sucralfate  1 g Oral QID       LOS: 9 days    Currie Paris, MD, [facility], [place] [phone]   06/03/2012 7:05 PM

## 2012-06-03 NOTE — Consult Note (Signed)
#   [id number] is the consult note.  Pete E.  Micah 7:7

## 2012-06-03 NOTE — Procedures (Signed)
Procedure:  CT guided drainage of abdominal fluid Findings:  Aspiration yielded serous appearing fluid.  Sample sent for culture studies.  10 Fr drain placed into left anterior aspect of fluid and placed to suction bulb.  Will follow output.

## 2012-06-03 NOTE — Consult Note (Signed)
INFECTIOUS DISEASE CONSULT NOTE  Date of Admission:  05/25/2012  Date of Consult:  06/03/2012  Reason for Consult: Abscess Referring Physician: Jennings/Streck  Impression/Recommendation Intra-abdominal Abscess GI bleed  Would- treat with anbx for 3 weeks total,  consider repeat scan at tend of therapy (if therapy uneventful),  biweekly CBC  Comment- I spoke with the patient and his wife (who is a International aid/development worker) at length. When I entered the room she was carrying a blue top jar of his abdominal fluid with her. She appears to be very involved in his care. I suggested to them that he could be discharged home on Invanz for total 3 weeks of therapy. She was not enthusiastic about him going home on IV therapy. I suggested that he could go home on an alternative such as Augmentin, that would have similar bacterial coverage. I let them know that the optimal coverage would best be decided by his culture of the fluid from his drain placed today. I reviewed his fluid with the microbiology tech, there were no bacteria, there were many white cells. I suggested to them that he would need at least bi-weekly white blood cell counts to monitor his therapy.   Thank you so much for this interesting consult,   Johny Sax [phone]  Anthonio Mizzell is an 51 y.o. male.  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 On 12-18 he developed severe abd pain after eating and was found to have free air. He was taken to OR 12-19 and found to have a perforated ileal/jejunal pouch as well as peritonitis. He was started on Invanz. By 12-23 was felt to be doing well enough for possible d/c, but overnight had temp 101.3. He underwent repeat CT abd showing persistent free air and free fluid. He developed dark stools, his WBC rose (16k by 12-25) and his H/H dropped. He had further fever and had a repeat CT scan showing persistent free air, free fluid.  He underwent upper endoscopy on 12-25 and was  found to have erosive duodenitis and a 3x6 cm gastric ulcer which was clipped. He was offered blood txf on several occasions but he and his wife declined this (as well as tylenol for his fever). On 12-26 he underwent contrast enema to evaluate and leak or obstruction was seen.  He was sent to IR today to get fluid sample from his abd fluid collections.   Past Medical History  Diagnosis Date  . Colon cancer     Past Surgical History  Procedure Date  . Colon surgery   . Laparotomy 05/26/2012    Procedure: EXPLORATORY LAPAROTOMY;  Surgeon: Cherylynn Ridges, MD;  Location: [facility] OR;  Service: General;  Laterality: N/A;  with resection of Jejunal polyp and enterolysis     No Known Allergies  Medications:  Scheduled:   . fentaNYL      . midazolam      . antiseptic oral rinse  15 mL Mouth Rinse BID  . ertapenem (INVANZ) IV  1 g Intravenous Q24H  . sucralfate  1 g Oral QID    Total days of antibiotics 10 (invanz)          Social History:  reports that he has never smoked. He does not have any smokeless tobacco history on file. He reports that he drinks alcohol. He reports that he does not use illicit drugs.  History reviewed. No pertinent family history.  General ROS: normal BM, normal urination, eating well. see HPI.  Blood pressure 114/69, pulse 106, temperature 98.3 F (36.8 C), temperature source Oral, resp. rate 18, height 5\' 11"  (1.803 m), weight 92.4 kg (203 lb 11.3 oz), SpO2 100.00%. General appearance: alert, cooperative and no distress Eyes: negative findings: pupils equal, round, reactive to light and accomodation Throat: lips, mucosa, and tongue normal; teeth and gums normal Neck: no adenopathy and supple, symmetrical, trachea midline Lungs: clear to auscultation bilaterally Heart: regular rate and rhythm Abdomen: normal findings: bowel sounds normal and soft, non-tender and midline wound with VAC. drain in place. bulb with serosanguinous d/c.  wound edges are clean.    Extremities: edema none   Results for orders placed during the hospital encounter of 05/25/12 (from the past 48 hour(s))  CBC     Status: Abnormal   Collection Time   06/01/12  3:50 PM      Component Value Range Comment   WBC 18.0 (*) 4.0 - 10.5 K/uL    RBC 2.18 (*) 4.22 - 5.81 MIL/uL    Hemoglobin 6.8 (*) 13.0 - 17.0 g/dL    HCT 16.1 (*) 09.6 - 52.0 %    MCV 88.5  78.0 - 100.0 fL    MCH 31.2  26.0 - 34.0 pg    MCHC 35.2  30.0 - 36.0 g/dL    RDW 04.5  40.9 - 81.1 %    Platelets 222  150 - 400 K/uL   TYPE AND SCREEN     Status: Normal (Preliminary result)   Collection Time   06/01/12  5:35 PM      Component Value Range Comment   ABO/RH(D) A POS      Antibody Screen POS      Sample Expiration 06/04/2012      Antibody Identification ANTI-Fya (DUFFY a)      DAT, IgG NEG      Unit Number [id number]      Blood Component Type RED CELLS,LR      Unit division 00      Status of Unit ISSUED,FINAL      Donor AG Type NEGATIVE FOR DUFFY A ANTIGEN      Transfusion Status OK TO TRANSFUSE      Crossmatch Result COMPATIBLE      Unit Number [id number]      Blood Component Type RED CELLS,LR      Unit division 00      Status of Unit ALLOCATED      Donor AG Type NEGATIVE FOR DUFFY A ANTIGEN      Transfusion Status OK TO TRANSFUSE      Crossmatch Result COMPATIBLE      Unit Number [id number]      Blood Component Type RED CELLS,LR      Unit division 00      Status of Unit ALLOCATED      Donor AG Type NEGATIVE FOR DUFFY A ANTIGEN      Transfusion Status OK TO TRANSFUSE      Crossmatch Result COMPATIBLE     PREPARE RBC (CROSSMATCH)     Status: Normal   Collection Time   06/01/12  5:35 PM      Component Value Range Comment   Order Confirmation ORDER PROCESSED BY BLOOD BANK     PROTIME-INR     Status: Abnormal   Collection Time   06/01/12  5:50 PM      Component Value Range Comment   Prothrombin Time 16.9 (*) 11.6 - 15.2 seconds    INR 1.41  0.00 - 1.49  CBC     Status:  Abnormal   Collection Time   06/02/12 12:33 AM      Component Value Range Comment   WBC 17.5 (*) 4.0 - 10.5 K/uL    RBC 2.28 (*) 4.22 - 5.81 MIL/uL    Hemoglobin 7.0 (*) 13.0 - 17.0 g/dL    HCT 82.9 (*) 56.2 - 52.0 %    MCV 86.8  78.0 - 100.0 fL    MCH 30.7  26.0 - 34.0 pg    MCHC 35.4  30.0 - 36.0 g/dL    RDW 13.0  86.5 - 78.4 %    Platelets 247  150 - 400 K/uL   CBC     Status: Abnormal   Collection Time   06/02/12  6:55 AM      Component Value Range Comment   WBC 18.3 (*) 4.0 - 10.5 K/uL    RBC 2.18 (*) 4.22 - 5.81 MIL/uL    Hemoglobin 6.8 (*) 13.0 - 17.0 g/dL    HCT 69.6 (*) 29.5 - 52.0 %    MCV 88.5  78.0 - 100.0 fL    MCH 31.2  26.0 - 34.0 pg    MCHC 35.2  30.0 - 36.0 g/dL    RDW 28.4  13.2 - 44.0 %    Platelets 260  150 - 400 K/uL   BASIC METABOLIC PANEL     Status: Abnormal   Collection Time   06/02/12  6:55 AM      Component Value Range Comment   Sodium 136  135 - 145 mEq/L    Potassium 3.8  3.5 - 5.1 mEq/L    Chloride 103  96 - 112 mEq/L    CO2 26  19 - 32 mEq/L    Glucose, Bld 103 (*) 70 - 99 mg/dL    BUN 22  6 - 23 mg/dL    Creatinine, Ser 1.02  0.50 - 1.35 mg/dL    Calcium 7.5 (*) 8.4 - 10.5 mg/dL    GFR calc non Af Amer >90  >90 mL/min    GFR calc Af Amer >90  >90 mL/min   PREPARE RBC (CROSSMATCH)     Status: Normal   Collection Time   06/02/12  8:51 AM      Component Value Range Comment   Order Confirmation ORDER PROCESSED BY BLOOD BANK     HEMOGLOBIN AND HEMATOCRIT, BLOOD     Status: Abnormal   Collection Time   06/02/12  3:31 PM      Component Value Range Comment   Hemoglobin 6.5 (*) 13.0 - 17.0 g/dL CRITICAL VALUE NOTED.  VALUE IS CONSISTENT WITH PREVIOUSLY REPORTED AND CALLED VALUE.   HCT 18.5 (*) 39.0 - 52.0 %   CBC     Status: Abnormal   Collection Time   06/02/12  8:11 PM      Component Value Range Comment   WBC 22.6 (*) 4.0 - 10.5 K/uL    RBC 2.31 (*) 4.22 - 5.81 MIL/uL    Hemoglobin 7.2 (*) 13.0 - 17.0 g/dL    HCT 72.5 (*) 36.6 - 52.0  %    MCV 89.2  78.0 - 100.0 fL    MCH 31.2  26.0 - 34.0 pg    MCHC 35.0  30.0 - 36.0 g/dL    RDW 44.0  34.7 - 42.5 %    Platelets 347  150 - 400 K/uL DELTA CHECK NOTED  RETICULOCYTES     Status: Abnormal  Collection Time   06/02/12  8:11 PM      Component Value Range Comment   Retic Ct Pct 3.9 (*) 0.4 - 3.1 %    RBC. 2.31 (*) 4.22 - 5.81 MIL/uL    Retic Count, Manual 90.1  19.0 - 186.0 K/uL   CBC     Status: Abnormal   Collection Time   06/03/12  7:40 AM      Component Value Range Comment   WBC 17.5 (*) 4.0 - 10.5 K/uL    RBC 2.04 (*) 4.22 - 5.81 MIL/uL    Hemoglobin 6.1 (*) 13.0 - 17.0 g/dL    HCT 04.5 (*) 40.9 - 52.0 %    MCV 89.7  78.0 - 100.0 fL    MCH 29.9  26.0 - 34.0 pg    MCHC 33.3  30.0 - 36.0 g/dL    RDW 81.1  91.4 - 78.2 %    Platelets 365  150 - 400 K/uL   COMPREHENSIVE METABOLIC PANEL     Status: Abnormal   Collection Time   06/03/12  7:40 AM      Component Value Range Comment   Sodium 134 (*) 135 - 145 mEq/L    Potassium 3.9  3.5 - 5.1 mEq/L    Chloride 102  96 - 112 mEq/L    CO2 25  19 - 32 mEq/L    Glucose, Bld 102 (*) 70 - 99 mg/dL    BUN 13  6 - 23 mg/dL    Creatinine, Ser 9.56  0.50 - 1.35 mg/dL    Calcium 7.7 (*) 8.4 - 10.5 mg/dL    Total Protein 5.3 (*) 6.0 - 8.3 g/dL    Albumin 1.8 (*) 3.5 - 5.2 g/dL    AST 29  0 - 37 U/L    ALT 29  0 - 53 U/L    Alkaline Phosphatase 67  39 - 117 U/L    Total Bilirubin 0.3  0.3 - 1.2 mg/dL    GFR calc non Af Amer >90  >90 mL/min    GFR calc Af Amer >90  >90 mL/min   CBC     Status: Abnormal   Collection Time   06/03/12 10:35 AM      Component Value Range Comment   WBC 19.6 (*) 4.0 - 10.5 K/uL    RBC 2.03 (*) 4.22 - 5.81 MIL/uL    Hemoglobin 6.2 (*) 13.0 - 17.0 g/dL    HCT 21.3 (*) 08.6 - 52.0 %    MCV 90.1  78.0 - 100.0 fL    MCH 30.5  26.0 - 34.0 pg    MCHC 33.9  30.0 - 36.0 g/dL    RDW 57.8  46.9 - 62.9 %    Platelets 389  150 - 400 K/uL   BASIC METABOLIC PANEL     Status: Abnormal   Collection Time    06/03/12 12:25 PM      Component Value Range Comment   Sodium 135  135 - 145 mEq/L    Potassium 3.9  3.5 - 5.1 mEq/L    Chloride 102  96 - 112 mEq/L    CO2 25  19 - 32 mEq/L    Glucose, Bld 98  70 - 99 mg/dL    BUN 12  6 - 23 mg/dL    Creatinine, Ser 5.28  0.50 - 1.35 mg/dL    Calcium 7.7 (*) 8.4 - 10.5 mg/dL    GFR calc non Af Amer >90  >  90 mL/min    GFR calc Af Amer >90  >90 mL/min       Component Value Date/Time   SDES BLOOD LEFT HAND 05/31/2012 1825   SPECREQUEST BOTTLES DRAWN AEROBIC AND ANAEROBIC 10CC 05/31/2012 1825   CULT        BLOOD CULTURE RECEIVED NO GROWTH TO DATE CULTURE WILL BE HELD FOR 5 DAYS BEFORE ISSUING A FINAL NEGATIVE REPORT 05/31/2012 1825   REPTSTATUS PENDING 05/31/2012 1825   Ct Abdomen Pelvis W Contrast  06/01/2012  *RADIOLOGY REPORT*  Clinical Data: Decreased hematocrit over last 24 hours.  Rule out intra-abdominal bleeding.  History of colectomy.  History of perforated small bowel with surgical repair 6 days ago.  CT ABDOMEN AND PELVIS WITH CONTRAST  Technique:  Multidetector CT imaging of the abdomen and pelvis was performed following the standard protocol during bolus administration of intravenous contrast.  Contrast: 80mL OMNIPAQUE IOHEXOL 300 MG/ML  SOLN  Comparison: CT abdomen 05/31/2012  Findings: Small bilateral pleural effusions are unchanged.  Moderate intraperitoneal air is present under the diaphragm, similar amount to yesterday.  This could be due to recent laparotomy.  Perforated bowel could also cause this.  There is a mild amount of free fluid around the liver.  There is a moderate amount of free fluid in the abdomen.  This fluid is located anteriorly and in both colic gutters.  This appears to be simple fluid and not blood based on the density.  This is similar to yesterday.  No retroperitoneal hematoma is present.  Liver and spleen are normal.  Pancreas is normal.  No renal obstruction or mass.  Abdominal aorta is not aneurysmal.  Small bowel is  mildly dilated with air-fluid levels.  This may reflect postop ileus.  Partial small bowel obstruction not excluded.  IMPRESSION: Free intraperitoneal air is unchanged from yesterday and may be due to recent laparotomy.  Moderate amount of free fluid is unchanged from yesterday.  The patient has had recent surgery with peritonitis.  Density is most consistent with simple fluid and not blood.  Negative for retroperitoneal hemorrhage.  Mild small bowel dilatation is unchanged and may be due to postop ileus or partial obstruction.   Original Report Authenticated By: Janeece Riggers, M.D.    Recent Results (from the past 240 hour(s))  CULTURE, BLOOD (ROUTINE X 2)     Status: Normal (Preliminary result)   Collection Time   05/31/12  6:19 PM      Component Value Range Status Comment   Specimen Description BLOOD RIGHT ARM   Final    Special Requests BOTTLES DRAWN AEROBIC AND ANAEROBIC 10CC   Final    Culture  Setup Time 05/31/2012 23:56   Final    Culture     Final    Value:        BLOOD CULTURE RECEIVED NO GROWTH TO DATE CULTURE WILL BE HELD FOR 5 DAYS BEFORE ISSUING A FINAL NEGATIVE REPORT   Report Status PENDING   Incomplete   CULTURE, BLOOD (ROUTINE X 2)     Status: Normal (Preliminary result)   Collection Time   05/31/12  6:25 PM      Component Value Range Status Comment   Specimen Description BLOOD LEFT HAND   Final    Special Requests BOTTLES DRAWN AEROBIC AND ANAEROBIC 10CC   Final    Culture  Setup Time 05/31/2012 23:57   Final    Culture     Final    Value:  BLOOD CULTURE RECEIVED NO GROWTH TO DATE CULTURE WILL BE HELD FOR 5 DAYS BEFORE ISSUING A FINAL NEGATIVE REPORT   Report Status PENDING   Incomplete       06/03/2012, 2:14 PM     LOS: 9 days

## 2012-06-03 NOTE — Progress Notes (Addendum)
2 Days Post-Op  Subjective: Say he feels fine, has not walked allot, says there is no more blood in the stool. I brought them the labs in and Dr. Leo Grosser is very clear that he is not currently symptomatic and they do not want blood now.  They would like GI  to see about control of bleeding if that is an issue. They would like Hematology to see and discuss adequacy of the Retic response.  Objective: Vital signs in last 24 hours: Temp:  [98.7 F (37.1 C)-99.6 F (37.6 C)] 98.7 F (37.1 C) (12/27 0622) Pulse Rate:  [104-108] 104  (12/27 0622) Resp:  [18-20] 18  (12/27 0622) BP: (110-120)/(59-71) 115/63 mmHg (12/27 0622) SpO2:  [96 %-99 %] 99 % (12/27 0622) Last BM Date: 06/02/12  Diet: Glutin Free, 480 ml PO recorded.  Afebrile, VSS, last temp 100.7 on 12/25@0300  hrs. WBC up still, H/H is drifting down, again Path shows No malignancy Small  Bowel resections, I do not see H Pylori  Intake/Output from previous day: 12/26 0701 - 12/27 0700 In: 2021.4 [P.O.:480; I.V.:1541.4] Out: 977 [Urine:950; Drains:25; Stool:2] Intake/Output this shift:    General appearance: alert, cooperative, no distress and pale Resp: clear to auscultation bilaterally GI: soft, non-tender; bowel sounds normal; no masses,  no organomegaly and he denies pain and wound vac is in place. He denies any blood in his stool.  Lab Results:   [facility] 06/03/12 0740 06/02/12 2011  WBC 17.5* 22.6*  HGB 6.1* 7.2*  HCT 18.3* 20.6*  PLT 365 347    BMET  Basename 06/02/12 0655  NA 136  K 3.8  CL 103  CO2 26  GLUCOSE 103*  BUN 22  CREATININE 0.71  CALCIUM 7.5*   PT/INR  Basename 06/01/12 1750  LABPROT 16.9*  INR 1.41    No results found for this basename: AST:5,ALT:5,ALKPHOS:5,BILITOT:5,PROT:5,ALBUMIN:5 in the last 168 hours   Lipase     Component Value Date/Time   LIPASE 67* 05/25/2012 2152     Studies/Results: Ct Abdomen Pelvis W Contrast  06/01/2012  *RADIOLOGY REPORT*  Clinical Data:  Decreased hematocrit over last 24 hours.  Rule out intra-abdominal bleeding.  History of colectomy.  History of perforated small bowel with surgical repair 6 days ago.  CT ABDOMEN AND PELVIS WITH CONTRAST  Technique:  Multidetector CT imaging of the abdomen and pelvis was performed following the standard protocol during bolus administration of intravenous contrast.  Contrast: 80mL OMNIPAQUE IOHEXOL 300 MG/ML  SOLN  Comparison: CT abdomen 05/31/2012  Findings: Small bilateral pleural effusions are unchanged.  Moderate intraperitoneal air is present under the diaphragm, similar amount to yesterday.  This could be due to recent laparotomy.  Perforated bowel could also cause this.  There is a mild amount of free fluid around the liver.  There is a moderate amount of free fluid in the abdomen.  This fluid is located anteriorly and in both colic gutters.  This appears to be simple fluid and not blood based on the density.  This is similar to yesterday.  No retroperitoneal hematoma is present.  Liver and spleen are normal.  Pancreas is normal.  No renal obstruction or mass.  Abdominal aorta is not aneurysmal.  Small bowel is mildly dilated with air-fluid levels.  This may reflect postop ileus.  Partial small bowel obstruction not excluded.  IMPRESSION: Free intraperitoneal air is unchanged from yesterday and may be due to recent laparotomy.  Moderate amount of free fluid is unchanged from yesterday.  The  patient has had recent surgery with peritonitis.  Density is most consistent with simple fluid and not blood.  Negative for retroperitoneal hemorrhage.  Mild small bowel dilatation is unchanged and may be due to postop ileus or partial obstruction.   Original Report Authenticated By: Janeece Riggers, M.D.    Dg Colon W/water Sol Cm  06/02/2012  *RADIOLOGY REPORT*  Clinical Data: History of total colectomy with an anastomosis of the ileum with a small portion of the remaining rectum, recent perforation and repeat surgery,  evaluate for leak  WATER SOLUBLE CONTRAST ENEMA  Technique:  Initial scout AP supine abdominal image was obtained. Water soluble contrast was introduced into the colon in a retrograde fashion and refluxed from the rectum to the cecum.  Spot images of the colon followed by overhead radiographs were obtained.  Fluoroscopy time: 2.4 minutes.  Comparison:  CT abdomen pelvis of 05/31/2012  Findings:  A preliminary film of the abdomen shows no bowel obstruction.  Surgical sutures and clips are noted in the left pelvis.  Water soluble contrast entered the short segment of remaining rectum with no obstruction.  There is passage of water soluble contrast into the distal ileum with no evidence of an anastomotic leak.  Contrast was taken into the distal small bowel as much as the patient could tolerate.  No evidence of extravasation is seen. A postevacuation film also shows no evidence of extravasation.  IMPRESSION: No evidence of an anastomotic leak.  I spoke with Dr. Johna Sheriff on 06/01/2012 at 12:52 p.m. concerning the results of this study.   Original Report Authenticated By: Dwyane Dee, M.D.     Medications:    . antiseptic oral rinse  15 mL Mouth Rinse BID  . ertapenem (INVANZ) IV  1 g Intravenous Q24H  . sucralfate  1 g Oral QID      . sodium chloride 50 mL/hr at 06/02/12 1704  . 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 Matthias Hughs. Anemia due to GI Bleed Hx of colon cancer 9 days of Invanz with rising WBC, no leak on contrast enema, on CT 05/31/12 there is some free fluid that is not consistent with blood. H. Pylori negative on Bx. Plan:  I  have talked with Dr. Jamey Ripa.  We will ask GI, ID and Hematology to see.  He has reviewed the CT again and would like to get a drain in the area to see if this is in fact infection. Dr. Jamey Ripa will talk to pt and Dr. Leo Grosser.  CT guided CT earlier today shows, yellow serous fluid and cultures placed, 10 French drain placed.    LOS: 9 days    Gabriel Lee 06/03/2012

## 2012-06-03 NOTE — Progress Notes (Signed)
The patient remains stable from the GI tract bleeding standpoint, even though there was a 1 g drop in hemoglobin overnight.   I am confident this patient is not having recurring upper GI bleeding, because he continues to have brown stools (he is status post subtotal colectomy, so blood within the GI tract would rapidly reach the rectum), and also because his BUN has not budged and remains well within the normal range. A repeat hemoglobin today is basically unchanged from the value from early this morning.  Biopsies obtained during his endoscopy 2 days ago are negative for Helicobacter pylori infection; patient informed.  We will make arrangements for the patient to see me as an outpatient several weeks from now, at which time we will set up a repeat endoscopy for late February, to confirm ulcer healing.  I would favor twice-daily PPI therapy for a month following discharge, after which he can reduce to once daily dosing.  Florencia Reasons, M.D. [phone]

## 2012-06-03 NOTE — H&P (Signed)
Agree 

## 2012-06-03 NOTE — Progress Notes (Signed)
Discussed situation with patient and I have again told them I thought he will benefit from transfusion, although he is stable and may yet do OK. I reviewed the CT and have recommended a ct guided abscess drainage since we are not certain if this fluid is infected and the cause of h is continued elevated wbc.  They continue to want to go home today, and I am still reluctant.   GI f/u still pending

## 2012-06-03 NOTE — H&P (Signed)
Gabriel Lee is an 51 y.o. male.   Chief Complaint: abd pain Hx colon ca; ileal-jejunal perforation with exploratory lap and partial resection 12/19 Wbc trending up CT shows abdominal fluid collection Scheduled for abdominal fluid collection aspiration vs drain placement Recent gastric ulcer; hgb 6.1 today Pt asymptomatic; refusing transfusion HPI: Colon ca; anastomotic leak  Past Medical History  Diagnosis Date  . Colon cancer     Past Surgical History  Procedure Date  . Colon surgery   . Laparotomy 05/26/2012    Procedure: EXPLORATORY LAPAROTOMY;  Surgeon: Cherylynn Ridges, MD;  Location: [facility] OR;  Service: General;  Laterality: N/A;  with resection of Jejunal polyp and enterolysis    History reviewed. No pertinent family history. Social History:  reports that he has never smoked. He does not have any smokeless tobacco history on file. He reports that he drinks alcohol. He reports that he does not use illicit drugs.  Allergies: No Known Allergies  Medications Prior to Admission  Medication Sig Dispense Refill  . loperamide (IMODIUM) 2 MG capsule Take 6 mg by mouth 2 (two) times daily. Slowing down of bowel movement        Results for orders placed during the hospital encounter of 05/25/12 (from the past 48 hour(s))  URINALYSIS, ROUTINE W REFLEX MICROSCOPIC     Status: Abnormal   Collection Time   06/01/12 11:25 AM      Component Value Range Comment   Color, Urine YELLOW  YELLOW    APPearance CLEAR  CLEAR    Specific Gravity, Urine 1.024  1.005 - 1.030    pH 5.5  5.0 - 8.0    Glucose, UA NEGATIVE  NEGATIVE mg/dL    Hgb urine dipstick SMALL (*) NEGATIVE    Bilirubin Urine NEGATIVE  NEGATIVE    Ketones, ur NEGATIVE  NEGATIVE mg/dL    Protein, ur NEGATIVE  NEGATIVE mg/dL    Urobilinogen, UA 0.2  0.0 - 1.0 mg/dL    Nitrite NEGATIVE  NEGATIVE    Leukocytes, UA NEGATIVE  NEGATIVE   URINE MICROSCOPIC-ADD ON     Status: Normal   Collection Time   06/01/12 11:25 AM   Component Value Range Comment   WBC, UA 0-2  <3 WBC/hpf    RBC / HPF 0-2  <3 RBC/hpf    Bacteria, UA RARE  RARE   OCCULT BLOOD X 1 CARD TO LAB, STOOL     Status: Abnormal   Collection Time   06/01/12  1:00 PM      Component Value Range Comment   Fecal Occult Bld POSITIVE (*) NEGATIVE   CBC     Status: Abnormal   Collection Time   06/01/12  3:50 PM      Component Value Range Comment   WBC 18.0 (*) 4.0 - 10.5 K/uL    RBC 2.18 (*) 4.22 - 5.81 MIL/uL    Hemoglobin 6.8 (*) 13.0 - 17.0 g/dL    HCT 16.1 (*) 09.6 - 52.0 %    MCV 88.5  78.0 - 100.0 fL    MCH 31.2  26.0 - 34.0 pg    MCHC 35.2  30.0 - 36.0 g/dL    RDW 04.5  40.9 - 81.1 %    Platelets 222  150 - 400 K/uL   TYPE AND SCREEN     Status: Normal (Preliminary result)   Collection Time   06/01/12  5:35 PM      Component Value Range Comment   ABO/RH(D) A  POS      Antibody Screen POS      Sample Expiration 06/04/2012      Antibody Identification ANTI-Fya (DUFFY a)      DAT, IgG NEG      Unit Number [id number]      Blood Component Type RED CELLS,LR      Unit division 00      Status of Unit ISSUED,FINAL      Donor AG Type NEGATIVE FOR DUFFY A ANTIGEN      Transfusion Status OK TO TRANSFUSE      Crossmatch Result COMPATIBLE      Unit Number [id number]      Blood Component Type RED CELLS,LR      Unit division 00      Status of Unit ALLOCATED      Donor AG Type NEGATIVE FOR DUFFY A ANTIGEN      Transfusion Status OK TO TRANSFUSE      Crossmatch Result COMPATIBLE      Unit Number [id number]      Blood Component Type RED CELLS,LR      Unit division 00      Status of Unit ALLOCATED      Donor AG Type NEGATIVE FOR DUFFY A ANTIGEN      Transfusion Status OK TO TRANSFUSE      Crossmatch Result COMPATIBLE     PREPARE RBC (CROSSMATCH)     Status: Normal   Collection Time   06/01/12  5:35 PM      Component Value Range Comment   Order Confirmation ORDER PROCESSED BY BLOOD BANK     PROTIME-INR     Status: Abnormal    Collection Time   06/01/12  5:50 PM      Component Value Range Comment   Prothrombin Time 16.9 (*) 11.6 - 15.2 seconds    INR 1.41  0.00 - 1.49   CBC     Status: Abnormal   Collection Time   06/02/12 12:33 AM      Component Value Range Comment   WBC 17.5 (*) 4.0 - 10.5 K/uL    RBC 2.28 (*) 4.22 - 5.81 MIL/uL    Hemoglobin 7.0 (*) 13.0 - 17.0 g/dL    HCT 52.8 (*) 41.3 - 52.0 %    MCV 86.8  78.0 - 100.0 fL    MCH 30.7  26.0 - 34.0 pg    MCHC 35.4  30.0 - 36.0 g/dL    RDW 24.4  01.0 - 27.2 %    Platelets 247  150 - 400 K/uL   CBC     Status: Abnormal   Collection Time   06/02/12  6:55 AM      Component Value Range Comment   WBC 18.3 (*) 4.0 - 10.5 K/uL    RBC 2.18 (*) 4.22 - 5.81 MIL/uL    Hemoglobin 6.8 (*) 13.0 - 17.0 g/dL    HCT 53.6 (*) 64.4 - 52.0 %    MCV 88.5  78.0 - 100.0 fL    MCH 31.2  26.0 - 34.0 pg    MCHC 35.2  30.0 - 36.0 g/dL    RDW 03.4  74.2 - 59.5 %    Platelets 260  150 - 400 K/uL   BASIC METABOLIC PANEL     Status: Abnormal   Collection Time   06/02/12  6:55 AM      Component Value Range Comment   Sodium 136  135 - 145 mEq/L    Potassium  3.8  3.5 - 5.1 mEq/L    Chloride 103  96 - 112 mEq/L    CO2 26  19 - 32 mEq/L    Glucose, Bld 103 (*) 70 - 99 mg/dL    BUN 22  6 - 23 mg/dL    Creatinine, Ser 1.91  0.50 - 1.35 mg/dL    Calcium 7.5 (*) 8.4 - 10.5 mg/dL    GFR calc non Af Amer >90  >90 mL/min    GFR calc Af Amer >90  >90 mL/min   PREPARE RBC (CROSSMATCH)     Status: Normal   Collection Time   06/02/12  8:51 AM      Component Value Range Comment   Order Confirmation ORDER PROCESSED BY BLOOD BANK     HEMOGLOBIN AND HEMATOCRIT, BLOOD     Status: Abnormal   Collection Time   06/02/12  3:31 PM      Component Value Range Comment   Hemoglobin 6.5 (*) 13.0 - 17.0 g/dL CRITICAL VALUE NOTED.  VALUE IS CONSISTENT WITH PREVIOUSLY REPORTED AND CALLED VALUE.   HCT 18.5 (*) 39.0 - 52.0 %   CBC     Status: Abnormal   Collection Time   06/02/12  8:11 PM       Component Value Range Comment   WBC 22.6 (*) 4.0 - 10.5 K/uL    RBC 2.31 (*) 4.22 - 5.81 MIL/uL    Hemoglobin 7.2 (*) 13.0 - 17.0 g/dL    HCT 47.8 (*) 29.5 - 52.0 %    MCV 89.2  78.0 - 100.0 fL    MCH 31.2  26.0 - 34.0 pg    MCHC 35.0  30.0 - 36.0 g/dL    RDW 62.1  30.8 - 65.7 %    Platelets 347  150 - 400 K/uL DELTA CHECK NOTED  RETICULOCYTES     Status: Abnormal   Collection Time   06/02/12  8:11 PM      Component Value Range Comment   Retic Ct Pct 3.9 (*) 0.4 - 3.1 %    RBC. 2.31 (*) 4.22 - 5.81 MIL/uL    Retic Count, Manual 90.1  19.0 - 186.0 K/uL   CBC     Status: Abnormal   Collection Time   06/03/12  7:40 AM      Component Value Range Comment   WBC 17.5 (*) 4.0 - 10.5 K/uL    RBC 2.04 (*) 4.22 - 5.81 MIL/uL    Hemoglobin 6.1 (*) 13.0 - 17.0 g/dL    HCT 84.6 (*) 96.2 - 52.0 %    MCV 89.7  78.0 - 100.0 fL    MCH 29.9  26.0 - 34.0 pg    MCHC 33.3  30.0 - 36.0 g/dL    RDW 95.2  84.1 - 32.4 %    Platelets 365  150 - 400 K/uL   COMPREHENSIVE METABOLIC PANEL     Status: Abnormal   Collection Time   06/03/12  7:40 AM      Component Value Range Comment   Sodium 134 (*) 135 - 145 mEq/L    Potassium 3.9  3.5 - 5.1 mEq/L    Chloride 102  96 - 112 mEq/L    CO2 25  19 - 32 mEq/L    Glucose, Bld 102 (*) 70 - 99 mg/dL    BUN 13  6 - 23 mg/dL    Creatinine, Ser 4.01  0.50 - 1.35 mg/dL    Calcium 7.7 (*) 8.4 -  10.5 mg/dL    Total Protein 5.3 (*) 6.0 - 8.3 g/dL    Albumin 1.8 (*) 3.5 - 5.2 g/dL    AST 29  0 - 37 U/L    ALT 29  0 - 53 U/L    Alkaline Phosphatase 67  39 - 117 U/L    Total Bilirubin 0.3  0.3 - 1.2 mg/dL    GFR calc non Af Amer >90  >90 mL/min    GFR calc Af Amer >90  >90 mL/min    Ct Abdomen Pelvis W Contrast  06/01/2012  *RADIOLOGY REPORT*  Clinical Data: Decreased hematocrit over last 24 hours.  Rule out intra-abdominal bleeding.  History of colectomy.  History of perforated small bowel with surgical repair 6 days ago.  CT ABDOMEN AND PELVIS WITH CONTRAST   Technique:  Multidetector CT imaging of the abdomen and pelvis was performed following the standard protocol during bolus administration of intravenous contrast.  Contrast: 80mL OMNIPAQUE IOHEXOL 300 MG/ML  SOLN  Comparison: CT abdomen 05/31/2012  Findings: Small bilateral pleural effusions are unchanged.  Moderate intraperitoneal air is present under the diaphragm, similar amount to yesterday.  This could be due to recent laparotomy.  Perforated bowel could also cause this.  There is a mild amount of free fluid around the liver.  There is a moderate amount of free fluid in the abdomen.  This fluid is located anteriorly and in both colic gutters.  This appears to be simple fluid and not blood based on the density.  This is similar to yesterday.  No retroperitoneal hematoma is present.  Liver and spleen are normal.  Pancreas is normal.  No renal obstruction or mass.  Abdominal aorta is not aneurysmal.  Small bowel is mildly dilated with air-fluid levels.  This may reflect postop ileus.  Partial small bowel obstruction not excluded.  IMPRESSION: Free intraperitoneal air is unchanged from yesterday and may be due to recent laparotomy.  Moderate amount of free fluid is unchanged from yesterday.  The patient has had recent surgery with peritonitis.  Density is most consistent with simple fluid and not blood.  Negative for retroperitoneal hemorrhage.  Mild small bowel dilatation is unchanged and may be due to postop ileus or partial obstruction.   Original Report Authenticated By: Janeece Riggers, M.D.    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 Water soluble contrast was introduced into the colon in a retrograde fashion and refluxed  from the rectum to the cecum.  Spot images of the colon followed by overhead radiographs were obtained.  Fluoroscopy time: 2.4 minutes.  Comparison:  CT abdomen pelvis of 05/31/2012  Findings:  A preliminary film of the abdomen shows no bowel obstruction.  Surgical sutures and clips are noted in the left pelvis.  Water soluble contrast entered the short segment of remaining rectum with no obstruction.  There is passage of water soluble contrast into the distal ileum with no evidence of an anastomotic leak.  Contrast was taken into the distal small bowel as much as the patient could tolerate.  No evidence of extravasation is seen. A postevacuation film also shows no evidence of extravasation.  IMPRESSION: No evidence of an anastomotic leak.  I spoke with Dr.  Hoxworth on 06/01/2012 at 12:52 p.m. concerning the results of this study.   Original Report Authenticated By: Dwyane Dee, M.D.     Review of Systems  Constitutional: Negative for fever.  Respiratory: Negative for shortness of breath.   Cardiovascular: Negative for chest pain.  Gastrointestinal: Positive for abdominal pain.  Musculoskeletal: Negative for back pain.  Neurological: Positive for weakness.    Blood pressure 115/63, pulse 104, temperature 98.7 F (37.1 C), temperature source Oral, resp. rate 18, height 5\' 11"  (1.803 m), weight 203 lb 11.3 oz (92.4 kg), SpO2 99.00%. Physical Exam  Constitutional: He is oriented to person, place, and time. He appears well-developed and well-nourished.  Cardiovascular: Normal rate, regular rhythm and normal heart sounds.   No murmur heard. Respiratory: Effort normal and breath sounds normal. He has no wheezes.  GI: Soft. Bowel sounds are normal. There is tenderness.  Musculoskeletal: Normal range of motion.  Neurological: He is alert and oriented to person, place, and time.  Psychiatric: He has a normal mood and affect. His behavior is normal. Judgment and thought content normal.      Assessment/Plan Abdominal fluid collection on CT Post exp lap/partial resection-  ileal/jejunal perforation Scheduled for abd fluid coll asp vs drain  Pt and wife aware of procedure benefits and risks and agreeable to proceed Request as little sedation as possible hgb 6.1- they are aware; asymptomatic Refusing transfusion Consent signed and in chart  Armistead Sult A 06/03/2012, 10:21 AM

## 2012-06-03 NOTE — Progress Notes (Signed)
Pt given Discharged instruction verbalized understanding prescription scrip given IV removed cath intact. Explained that hospital not responsible for valuables Pt and family had no question at this time. Ilean Skill

## 2012-06-04 NOTE — Consult Note (Signed)
NAMEHUEL, CENTOLA NO.:  [id number]  MEDICAL RECORD NO.:  [id number]  LOCATION:  6N24C                        FACILITY:  [facility]  PHYSICIAN:  Josph Macho, M.D.  DATE OF BIRTH:  1960-12-31  DATE OF CONSULTATION: DATE OF DISCHARGE:  06/03/2012                                CONSULTATION   REFERRING PHYSICIAN:  Currie Paris, M.D.  REASON FOR CONSULTATION:  GI bleeding with anemia.  HISTORY OF PRESENT ILLNESS:  Mr. Wentzell is a very nice 51 year old white gentleman.  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  He has a history of colon cancer.  He had a total colectomy in [place] back about 4 years ago.  He had stage III colon cancer from what he says.  He said he had one lymph node that was positive.  He is very knowledgeable about his medical history.  He did take adjuvant chemotherapy with FOLFOX.  He has moved to the [place] area.  His girlfriend is a Administrator, Civil Service.  He has been doing well.  He is a Radio producer.  He apparently had upper GI bleed.  He was on nonsteroidals.  He was admitted on the 18th.  At that point in time, he had abdominal pain.  He had free air in the abdomen.  He subsequently was taken to Surgery.  He was found to have a perforation.  He had peritoneal fluid that was foul-smelling greenish fluid.  He underwent I think repair of this.  There was partially ischemic double J-pouch, which appeared to perforate.  When he was admitted, his hemoglobin was 17.2, hematocrit 51.3, white cell count was 4.6, platelet count was 223.  He subsequently began to drop his hemoglobin on the 29th.  His hemoglobin went from 11.6 down to 7.7 in 1 day.  White cell count was 16.1.  Platelet count was 210.  He would then was seen by GI.  I think he had an endoscopy by Dr. Matthias Hughs on Christmas Day.  He had a large gastric ulcer with adherent clot.  Biopsies were taken.  There was no malignancy.  He was changed over  Protonix infusion.  He had added Carafate.  His blood count has continued to drop.  There has been some stabilization, however.  On 26th, hemoglobin was 6.8 and hematocrit 19.3.  I guess he was given I think 1 unit of blood from what I can tell.  I think he had this on the 25th.  Today, his hemoglobin is 6.2, white cell count was 19.6, and platelet count was 389.  His white cell differential has been normal.  He has had an increased in retic count, which was appropriate for his blood loss.  He has not had any problems with both of the kidneys.  BUN and creatinine have been okay.  We were asked to see him to see if we can help improve his hemoglobin without transfusions.  PAST MEDICAL HISTORY:  Remarkable for polyposis syndrome and stage III colon cancer.  ALLERGIES:  None.  HOME MEDICATIONS:  Imodium.  FAMILY HISTORY:  Negative for any history of malignancy.  There was no history of bleeding in the family.  SOCIAL HISTORY:  Negative for tobacco use.  He has rare alcohol use.  REVIEW OF SYSTEMS:  As stated in the history of present illness.  PHYSICAL EXAMINATION:  GENERAL:  This is a well-developed, well- nourished, white gentleman in no obvious distress.  He is alert and oriented x3. VITAL SIGNS:  Temperature of 98.1, pulse 107, respiratory 16, blood pressure 116/59. HEAD AND NECK:  Shows no ocular or oral lesions.  There is no scleral icterus. NECK:  There is no adenopathy in the neck.  He has no glossitis. LUNGS:  Clear bilaterally. CARDIAC:  Tachycardic, but regular.  There are no murmurs, rubs, or bruits. ABDOMEN:  Shows a negative pressure drain coming from the abdominal wound.  He also has a drainage, JP drain with yellow clear fluid.  He has good bowel sounds.  There is no abdominal distention. EXTREMITIES:  Shows no clubbing, cyanosis, or edema.  I looked at his blood smear that was done on few days ago.  His white cells appear reactive.  I do not see any  immature neutrophils.  I do not see any myeloid cells are immature.  There are no hypersegmented polys. Red cells are with no rouleaux formation.  There is no cystocytes.  He does have some polychromasia.  Platelets are adequate in number and size.  IMPRESSION:  Mr. Chapa is a 51 year old gentleman with intestinal perforation, this was repaired.  Then had an upper gastrointestinal bleed.  There is no surprise that his hemoglobin is low.  It is going to take a while for his bone marrow to recover from this "double insult."  I suspect that he is iron deficient.  With that kind of blood loss, would not surprise me.  I believe that IV iron would help, but his girlfriend does not want to have any.  I explained to him that there is about 1-2% risk of reaction.  I would premedicate him.  This was too high for her to feel comfortable with.  He is on a proton pump inhibitor.  Extremely hard for him to absorb oral iron.  I think that folic acid would be a good idea for him.  Again, he can take this over the counter.  He is a young guy.  He is a healthy guy.  He can handle a low hemoglobin.  His girlfriend really wants to go home tonight.  I guess that it is okay from my point of view.  Again, I am not the one who would discharge him, which is what I told her.  If his hemoglobin does not go up as anticipated, then I think I going to need a dose of IV iron.  His girlfriend is very much into naturopathic type interventions.  I understand this.  Again, I think that full casting would be good for him.  This can be done as an outpatient.  If he wants to take oral iron, this should be okay.  I recommend that he also take vitamin C 500 mg a day.  I do not see any indication for ESA.  I would think that potential complications would really not sit well with his girlfriend.  We certainly will follow along either as an inpatient or an outpatient. Again, it is not much that we really need  to do right now.     Josph Macho, M.D.     PRE/MEDQ  D:  06/03/2012  T:  06/04/2012  Job:  [id number]

## 2012-06-05 LAB — TYPE AND SCREEN
Antibody Screen: POSITIVE
DAT, IgG: NEGATIVE
Donor AG Type: NEGATIVE
Donor AG Type: NEGATIVE

## 2012-06-05 LAB — CULTURE, ROUTINE-ABSCESS: Culture: NO GROWTH

## 2012-06-06 ENCOUNTER — Telehealth (INDEPENDENT_AMBULATORY_CARE_PROVIDER_SITE_OTHER): Payer: Self-pay | Admitting: General Surgery

## 2012-06-06 ENCOUNTER — Other Ambulatory Visit (INDEPENDENT_AMBULATORY_CARE_PROVIDER_SITE_OTHER): Payer: Self-pay

## 2012-06-06 DIAGNOSIS — Z9889 Other specified postprocedural states: Secondary | ICD-10-CM

## 2012-06-06 LAB — CULTURE, BLOOD (ROUTINE X 2)
Culture: NO GROWTH
Culture: NO GROWTH

## 2012-06-06 NOTE — Telephone Encounter (Signed)
Lyla Son, nurse with [facility], called to verify labs to be drawn.  She will get the CBC.

## 2012-06-06 NOTE — Telephone Encounter (Signed)
Called patient and left message for him to call back to get appt info. He is scheduled to see Lindie Spruce on Thursday 06/09/12 @ 2:00. He also needs CBC today. I put orders in for him to got to [facility] to get CBC done. If he has a home care agency that can do lab that is fine. I just don't know if he has one and who I would call to order them to do it. Please inform pt of this if triage gets the call.

## 2012-06-06 NOTE — Telephone Encounter (Signed)
Message copied by Liliana Cline on Mon Jun 06, 2012  9:21 AM ------      Message from: Currie Paris      Created: Sat Jun 04, 2012  8:50 AM       I discharged him late Friday evening. He will need to be seen next week. He went home on Augmentin. He has a wound vac on. He has a perc drain into the abd collection which looks clear, cultures pending. He is on Carafate and Protonix for his gastric ulcer. Recommendation by GI was one month of BID PPI. ID thinks he should have wbc checked twice a week, should be done Monday or Tuesday. His Hgb was 6 when he went home, and needs to be followed as well. Did not put him on iron, but likely will need. Had GI, ID, and heme consults on Friday before discharge.            I told him our office would call Monday to arrange labs and f/u.

## 2012-06-08 LAB — ANAEROBIC CULTURE

## 2012-06-09 ENCOUNTER — Other Ambulatory Visit (INDEPENDENT_AMBULATORY_CARE_PROVIDER_SITE_OTHER): Payer: Self-pay | Admitting: General Surgery

## 2012-06-09 ENCOUNTER — Telehealth: Payer: Self-pay | Admitting: Hematology & Oncology

## 2012-06-09 ENCOUNTER — Other Ambulatory Visit: Payer: Self-pay | Admitting: *Deleted

## 2012-06-09 ENCOUNTER — Ambulatory Visit (INDEPENDENT_AMBULATORY_CARE_PROVIDER_SITE_OTHER): Payer: Managed Care, Other (non HMO) | Admitting: General Surgery

## 2012-06-09 ENCOUNTER — Encounter (INDEPENDENT_AMBULATORY_CARE_PROVIDER_SITE_OTHER): Payer: Self-pay | Admitting: General Surgery

## 2012-06-09 VITALS — BP 120/82 | HR 94 | Temp 97.9°F | Resp 16 | Ht 71.0 in | Wt 173.4 lb

## 2012-06-09 DIAGNOSIS — D649 Anemia, unspecified: Secondary | ICD-10-CM

## 2012-06-09 DIAGNOSIS — R188 Other ascites: Secondary | ICD-10-CM

## 2012-06-09 DIAGNOSIS — Z09 Encounter for follow-up examination after completed treatment for conditions other than malignant neoplasm: Secondary | ICD-10-CM | POA: Insufficient documentation

## 2012-06-09 NOTE — Progress Notes (Signed)
The patient is status post exploratory laparotomy for peritonitis and free air. He had a ruptured small bowel polyps from a previous total colectomy.  Postoperatively he had complications with tachycardia, anemia, gastric ulceration, and possible intra-abdominal abscess with percutaneous drainage.  The percutaneous drain was done on 06/03/2012. The cultures from that have not grown out any bacteria. The patient was discharged home on oral Augmentin therapy. He continues to take that.  One reason why his cultures may be negative this because the patient was on antibiotics prior to cultures being sent. It also may be a true negative with no growth bacterial cultures. His blood cultures were also negative.  On examination today he has a well granulating wound no evidence of infection. The umbilicus there is a cavitary area with a suture underneath it. This represents the patient's suture from his abdominal closure. The wound is nearly healed and because of that wet-to-dry dressing with saline and 4 x 4 gauze can be exchanged for the negative pressure wound dressing.  The wet-to-dry dressing can be done by the patient's wife. We will contact home health. He is due to get a repeat CBC tomorrow and I will repeat his CT scan of his abdomen and pelvis to look for resolution of the fluid collection next week.

## 2012-06-09 NOTE — Telephone Encounter (Signed)
Pt aware of 1-3 lab appointment wants Dr. Adelene Idler to add a CA125 Amy RN aware

## 2012-06-10 ENCOUNTER — Other Ambulatory Visit (HOSPITAL_BASED_OUTPATIENT_CLINIC_OR_DEPARTMENT_OTHER): Payer: Managed Care, Other (non HMO) | Admitting: Lab

## 2012-06-10 DIAGNOSIS — D649 Anemia, unspecified: Secondary | ICD-10-CM

## 2012-06-10 LAB — IRON AND TIBC
Iron: 22 ug/dL — ABNORMAL LOW (ref 42–165)
UIBC: 301 ug/dL (ref 125–400)

## 2012-06-10 LAB — CBC WITH DIFFERENTIAL (CANCER CENTER ONLY)
BASO%: 1 % (ref 0.0–2.0)
EOS%: 0.8 % (ref 0.0–7.0)
HGB: 8.4 g/dL — ABNORMAL LOW (ref 13.0–17.1)
LYMPH#: 1.4 10*3/uL (ref 0.9–3.3)
MCHC: 30.5 g/dL — ABNORMAL LOW (ref 32.0–35.9)
NEUT#: 12.5 10*3/uL — ABNORMAL HIGH (ref 1.5–6.5)
RDW: 14.2 % (ref 11.1–15.7)

## 2012-06-10 LAB — TECHNOLOGIST REVIEW CHCC SATELLITE

## 2012-06-12 ENCOUNTER — Telehealth (INDEPENDENT_AMBULATORY_CARE_PROVIDER_SITE_OTHER): Payer: Self-pay | Admitting: General Surgery

## 2012-06-12 NOTE — Telephone Encounter (Signed)
Pt's partner called to discuss a new foul odor from wound.  She stated that he had recently had wound vac d/c'd by Dr. Lindie Spruce because of tunneling areas healing too quickly.  They had switched to wet-to-dry dressings.  There is now a bit more drainage from wound that is malodorous where it had been clean.  They are concerned and would like wound evaluated.  Recommended urgent office tomorrow.  Pt already on augmentin.  No fevers.

## 2012-06-13 ENCOUNTER — Ambulatory Visit (INDEPENDENT_AMBULATORY_CARE_PROVIDER_SITE_OTHER): Payer: Managed Care, Other (non HMO) | Admitting: General Surgery

## 2012-06-13 ENCOUNTER — Encounter (INDEPENDENT_AMBULATORY_CARE_PROVIDER_SITE_OTHER): Payer: Self-pay | Admitting: General Surgery

## 2012-06-13 VITALS — BP 120/84 | HR 124 | Temp 96.4°F | Resp 16 | Ht 71.0 in | Wt 168.6 lb

## 2012-06-13 DIAGNOSIS — Z09 Encounter for follow-up examination after completed treatment for conditions other than malignant neoplasm: Secondary | ICD-10-CM

## 2012-06-13 NOTE — Telephone Encounter (Signed)
I called Bonita Quin back and made urgent office app today with Hoxworth @ 3:15.

## 2012-06-13 NOTE — Progress Notes (Signed)
History: Patient comes back to the urgent office early due to his girlfriend's concern about odor from his midline wound. The VAC dressing was removed last week and she noted an unpleasant odor from the wound over the last couple of days.  Exam: BP 120/84  Pulse 124  Temp 96.4 F (35.8 C) (Temporal)  Resp 16  Ht 5\' 11"  (1.803 m)  Wt 168 lb 9.6 oz (76.476 kg)  BMI 23.51 kg/m2 General: Somewhat weak but not acutely ill-appearing Abdomen: Perforating right lower quadrant with clear serosanguineous fluid. His midline wound is generally clean granulation this small area of exposed suture material and a very small area of associated necrotic tissue just above the umbilicus. This was cleaned and probed and there was no unusual drainage and no particular odor today also confirmed by his girlfriend.  Assessment and plan: Status post laparotomy and small bowel resection with postoperative intra-abdominal drain. Open wound. Everything looks stable to me. He is due to have a CT scan in 2 days. Continue current treatment and she is encouraged to call should she notice any concerns.

## 2012-06-14 ENCOUNTER — Encounter (INDEPENDENT_AMBULATORY_CARE_PROVIDER_SITE_OTHER): Payer: Self-pay

## 2012-06-15 ENCOUNTER — Ambulatory Visit
Admission: RE | Admit: 2012-06-15 | Discharge: 2012-06-15 | Disposition: A | Discharge status: Home / Self Care | Payer: Managed Care, Other (non HMO) | Source: Ambulatory Visit | Attending: General Surgery | Admitting: General Surgery

## 2012-06-15 ENCOUNTER — Telehealth (INDEPENDENT_AMBULATORY_CARE_PROVIDER_SITE_OTHER): Payer: Self-pay | Admitting: General Surgery

## 2012-06-15 DIAGNOSIS — R188 Other ascites: Secondary | ICD-10-CM

## 2012-06-15 MED ORDER — IOHEXOL 300 MG/ML  SOLN
100.0000 mL | Freq: Once | INTRAMUSCULAR | Status: AC | PRN
  Administered 2012-06-15: 100 mL via INTRAVENOUS

## 2012-06-15 NOTE — Progress Notes (Signed)
Patient states that he was instructed to NOT flush drain.  States that drain output has been 5-7 mL for the last several days.    Taking Augmentin.    1515  Drain removed by Dr Deanne Coffer.  Abd incision clean & dry.  Dressing applied.    1530  Discharged to home with spouse.

## 2012-06-15 NOTE — Telephone Encounter (Signed)
Pt's partner called to report he is running a fever of 101.5 overnight.  They are letting the fever run, as they don't believe in treating with antipyretics at this point.  Advised to monitor the fever and push po fluids to prevent dehydration.  They will attend CT as scheduled.

## 2012-06-20 ENCOUNTER — Other Ambulatory Visit: Payer: Self-pay | Admitting: *Deleted

## 2012-06-20 DIAGNOSIS — D509 Iron deficiency anemia, unspecified: Secondary | ICD-10-CM

## 2012-06-20 DIAGNOSIS — D649 Anemia, unspecified: Secondary | ICD-10-CM

## 2012-06-20 DIAGNOSIS — C189 Malignant neoplasm of colon, unspecified: Secondary | ICD-10-CM

## 2012-06-20 NOTE — Discharge Summary (Signed)
Physician Discharge Summary  Patient ID: Gabriel Lee MRN: [id number] DOB/AGE: Oct 16, 1960 52 y.o.  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 Principal Problem:  *Perforated intestine   PROCEDURES: 1. EXPLORATORY LAPAROTOMY ENTERECTOMY\PARTIAL RESECTION OF POUCH, 05/26/2012, Cherylynn Ridges, MD.  2. Upper endoscopy with control of hemorrhage (clipping), 06/01/2012, Florencia Reasons, MD  3. CT guided abscess drainage with placement of 10 French catheter. 04/03/12   Consults:  Florencia Reasons, MD GI service;  Arlan Organ, MD  Oncology/Hematology, Ginnie Smart, MD Infectious Disease    Hospital Course: Pt presented on 05/25/12 with a history of total abdominal colectomy in late 2009 with ileoanal anastomosis, was having a normal evening, had just eaten dinner at [employer], was visiting friends, developed severe abdominal pain, and now is rigid with plain X-rays showing significant free air. OR ASAP.  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 As a teenager he had an exploratory laparotomy for trauma, which by his report was non-therapeutic. Has upper midline abdominal scar. No history of ulcer disease or NSAIAs usage. Pain was sudden in onset  He was seen in the ER and admitted by Dr. Lindie Spruce for free air and peritonitis. He was taken to the OR that evening with significant peritonitis. He was found to have perforated area in the sidewall of the jejunum with frank stool like contents leaking into the  peritoneal cavity, and 4 quadrant enteric contamination of the abdominal cavity.  Post op he was transferred to the floor.  He was placed on long term antibiotics, and made slow improvement. The wound was left open and wound vac placed.  He was started back on liquids 05/30/12.  He had low grade fevers on 05/31/12, repeat CT showed some residual air and fluid, but no obvious abscess. With fevers up to 102 we had a request from his wife not to treat with tylenol.  She wanted a full immune response.  On 06/01/12 he dropped his hemoglobin from 11.6 to 7.7.  He also had some Hemepositive stools. GI consult was obtained with Dr.Buccini, and underwent EGD.  This showed no active bleed, but old blood in the stomach; with likely source of bleeding from a large deep ulcer on the proximal lesser curve of the stomach. There was also some erythematous gastritis, and erosive duodenitis. He was placed on a Protonix drip. With his hemoglobin down to 7.0 then 6.5, then 6.1; Dr. Jamey Ripa recommended transfusion, but patient and family declined, and actually wanted to go home.  He also had an elevated WBC on 06/01/12 and had a repeat CT scan which showed no evidence of a leak from the anastomosis, there was some free fluid. He then had a contrast Enema for evaluation of his anastomosis which showed: No evidence of an anastomotic leak.     With ongoing WBC elevation we ask IR to evaluate for a drain placement which was done on 06/03/12.  Fluid was serous, and a 43F drain was place in the evening of 06/03/12. Microbiological analysis showed allot of WBC, but no growth on the culture.  He was seen in the afternoon of 06/03/12 by Dr.  Hatcher from ID. It was his opinion pt had an intra-abdominal abscess.  His initial recommendation was Invanz for 3 weeks, family was not enthusiastic about IV treatment and a 2nd recommendation of Augmentin was made with bi weekly WBC evaluations.   He was seen again by Dr. Matthias Hughs on 06/03/13, with  drop in H/H and he was comfortable with pt. current status and recommended BID PPI treatment for 1 month, followed by daily treatment after that. He was tolerating a Glutin Free diet at that point.  Dr. Myna Hidalgo saw the patient on 06/03/13 to evaluate his hematologic response. It was his opinion pt was iron deficient from blood loss.   He noted oral iron absorption on PPI was poor and that IV iron would be helpful, but family did not wish to do this. Folic acid was also recommended.  It was his opinion the pt was young, healthy and should do well but it  would take some time for full resolution of his anemia.  He was seen again in the evening of 06/03/12, by Dr. Jamey Ripa; he was stable.  He had been seen by GI, ID, Hematology, and drain placed by IR.  He had a wound vac on his open incision.  Home health had been set up for outpatient care. The family and patient were adamant about Lee home that evening, so he was discharged in the evening of 06/03/12, by Dr. Jamey Ripa, with plans for follow up with Dr. Lindie Spruce as noted below.      Disposition: 01-Home or Self Care  Discharge Orders    Future Appointments: Provider: Department: Dept Phone: Center:   07/05/2012 12:00 PM Cherylynn Ridges, MD [facility], [place] [phone] None     Future Orders Please Complete By Expires   Increase activity slowly      Discharge instructions      Comments:   [facility], [place] [phone]  OPEN ABDOMINAL SURGERY: POST OP INSTRUCTIONS  Always review your discharge instruction sheet given to you by the facility where your surgery was performed.  IF YOU HAVE DISABILITY OR FAMILY LEAVE FORMS, YOU MUST BRING THEM TO THE OFFICE FOR PROCESSING.  PLEASE DO NOT GIVE THEM TO YOUR DOCTOR.  A prescription for pain medication may be given to you upon discharge.  Take your pain medication as prescribed, if needed.  If narcotic pain medicine is not needed, then you may take acetaminophen (Tylenol) or  ibuprofen (Advil) as needed. Take your usually prescribed medications unless otherwise directed. If you need a refill on your pain medication, please contact your pharmacy. They will contact our office to request authorization.  Prescriptions will not be filled after 5pm or on week-ends. You should follow a light diet the first few days after arrival home, such as soup and crackers, pudding, etc.unless your doctor has advised otherwise. A high-fiber, low fat diet can be resumed as tolerated.   Be sure to include lots of fluids daily. Most patients will experience some swelling and bruising on the chest and neck area.  Ice packs will help.  Swelling and bruising can take several days to resolve Most patients will experience some swelling and bruising in the area of the incision. Ice pack will help. Swelling and bruising can take several days to resolve..  It is common to experience some constipation if taking pain medication after surgery.  Increasing fluid intake and taking a stool softener will usually help or prevent this problem from  occurring.  A mild laxative (Milk of Magnesia or Miralax) should be taken according to package directions if there are no bowel movements after 48 hours.  You may have steri-strips (small skin tapes) in place directly over the incision.  These strips should be left on the skin for 7-10 days.  If your surgeon used skin glue on the incision, you may shower in 24 hours.  The glue will flake off over the next 2-3 weeks.  Any sutures or staples will be removed at the office during your follow-up visit. You may find that a light gauze bandage over your incision may keep your staples from being rubbed or pulled. You may shower and replace the bandage daily. ACTIVITIES:  You may resume regular (light) daily activities beginning the next day-such as daily self-care, walking, climbing stairs-gradually increasing activities as tolerated.  You may have sexual intercourse when it is  comfortable.  Refrain from any heavy lifting or straining until approved by your doctor. You may drive when you no longer are taking prescription pain medication, you can comfortably wear a seatbelt, and you can safely maneuver your car and apply brakes Return to Work: ___________________________________ Bonita Quin should see your doctor in the office for a follow-up appointment approximately two weeks after your surgery.  Make sure that you call for this appointment within a day or two after you arrive home to insure a convenient appointment time. OTHER INSTRUCTIONS:  _____________________________________________________________ _____________________________________________________________  WHEN TO CALL YOUR DOCTOR: Fever over 101.0 Inability to urinate Nausea and/or vomiting Extreme swelling or bruising Continued bleeding from incision. Increased pain, redness, or drainage from the incision. Difficulty swallowing or breathing Muscle cramping or spasms. Numbness or tingling in hands or feet or around lips.  The clinic staff is available to answer your questions during regular business hours.  Please don't hesitate to call and ask to speak to one of the nurses if you have concerns.  For further questions, please visit [email or web address]   Remove dressing in 48 hours      Scheduling Instructions:   Change VAC in 48 Hours       Medication List     As of 06/20/2012  4:03 PM    TAKE these medications         amoxicillin-clavulanate 875-125 MG per tablet   Commonly known as: AUGMENTIN   Take 1 tablet by mouth 2 (two) times daily.      HYDROmorphone 2 MG tablet   Commonly known as: DILAUDID   Take 1-2 tablets (2-4 mg total) by mouth every 4 (four) hours as needed for pain.      loperamide 2 MG capsule   Commonly known as: IMODIUM   Take 6 mg by mouth 2 (two) times daily. Slowing down of bowel movement      pantoprazole 40 MG tablet   Commonly known as: PROTONIX   Take 1  tablet (40 mg total) by mouth 2 (two) times daily. Take twice daily      sucralfate 1 G tablet   Commonly known as: CARAFATE   Take 1 tablet (1 g total) by mouth 4 (four) times daily.           Follow-up Information    Follow up with WYATT, Marta Lamas, MD. Schedule an appointment as soon as possible for a visit in 1 week.   Contact information:   [address] [place], [place] [address] [phone]  SignedSherrie George 06/20/2012, 4:03 PM

## 2012-06-20 NOTE — Progress Notes (Signed)
Pt's s.o called requesting to have his labs redrawn this week in the [place] office. Reviewed her request with Dr Myna Hidalgo to have a CBC, iron studies, and CEA. Informed her that Dr Myna Hidalgo doesn't believe there will be a change in his iron panel but she would like to have it checked as his PO iron has been increased. She understands that his insurance may not pay for the iron studies 2 weeks in a row but is willing to check on the cost and call the insurance company and lab on her own. Will send request to [place] office to set up for tomorrow as requested.

## 2012-06-21 ENCOUNTER — Other Ambulatory Visit: Payer: Self-pay | Admitting: *Deleted

## 2012-06-21 ENCOUNTER — Other Ambulatory Visit (HOSPITAL_BASED_OUTPATIENT_CLINIC_OR_DEPARTMENT_OTHER): Payer: Managed Care, Other (non HMO) | Admitting: Lab

## 2012-06-21 ENCOUNTER — Encounter (HOSPITAL_COMMUNITY): Payer: Self-pay | Admitting: General Surgery

## 2012-06-21 DIAGNOSIS — D509 Iron deficiency anemia, unspecified: Secondary | ICD-10-CM

## 2012-06-21 DIAGNOSIS — K651 Peritoneal abscess: Secondary | ICD-10-CM

## 2012-06-21 DIAGNOSIS — K922 Gastrointestinal hemorrhage, unspecified: Secondary | ICD-10-CM

## 2012-06-21 DIAGNOSIS — D500 Iron deficiency anemia secondary to blood loss (chronic): Secondary | ICD-10-CM

## 2012-06-21 DIAGNOSIS — C189 Malignant neoplasm of colon, unspecified: Secondary | ICD-10-CM

## 2012-06-21 DIAGNOSIS — D649 Anemia, unspecified: Secondary | ICD-10-CM

## 2012-06-21 HISTORY — DX: Iron deficiency anemia secondary to blood loss (chronic): D50.0

## 2012-06-21 HISTORY — DX: Gastrointestinal hemorrhage, unspecified: K92.2

## 2012-06-21 HISTORY — DX: Peritoneal abscess: K65.1

## 2012-06-21 LAB — CBC WITH DIFFERENTIAL/PLATELET
BASO%: 0.8 % (ref 0.0–2.0)
Basophils Absolute: 0.1 10*3/uL (ref 0.0–0.1)
EOS%: 2 % (ref 0.0–7.0)
HCT: 34.1 % — ABNORMAL LOW (ref 38.4–49.9)
HGB: 11 g/dL — ABNORMAL LOW (ref 13.0–17.1)
LYMPH%: 17.2 % (ref 14.0–49.0)
MCH: 27.4 pg (ref 27.2–33.4)
MCHC: 32.1 g/dL (ref 32.0–36.0)
MCV: 85.2 fL (ref 79.3–98.0)
MONO%: 8.9 % (ref 0.0–14.0)
NEUT%: 71.1 % (ref 39.0–75.0)
Platelets: 453 10*3/uL — ABNORMAL HIGH (ref 140–400)
lymph#: 1.3 10*3/uL (ref 0.9–3.3)

## 2012-06-21 LAB — IRON AND TIBC: TIBC: 391 ug/dL (ref 215–435)

## 2012-06-21 LAB — CEA: CEA: 0.6 ng/mL (ref 0.0–5.0)

## 2012-06-22 ENCOUNTER — Telehealth: Payer: Self-pay | Admitting: Oncology

## 2012-06-22 NOTE — Telephone Encounter (Addendum)
Message copied by Lacie Draft on Wed Jun 22, 2012  4:44 PM ------      Message from: Arlan Organ R      Created: Wed Jun 22, 2012  7:38 AM       Call:  Anemia is MUCH better!!  Iron still low, but this will improve slowly!!!  CEA is normal!!!  Excellent!!!  Hewitt Shorts  Message given to [place]. Will also fax results.

## 2012-06-23 ENCOUNTER — Encounter (INDEPENDENT_AMBULATORY_CARE_PROVIDER_SITE_OTHER): Payer: Self-pay | Admitting: General Surgery

## 2012-06-23 ENCOUNTER — Ambulatory Visit (INDEPENDENT_AMBULATORY_CARE_PROVIDER_SITE_OTHER): Payer: Managed Care, Other (non HMO) | Admitting: General Surgery

## 2012-06-23 VITALS — BP 118/80 | HR 92 | Temp 97.1°F | Resp 16 | Ht 71.0 in | Wt 163.2 lb

## 2012-06-23 DIAGNOSIS — Z09 Encounter for follow-up examination after completed treatment for conditions other than malignant neoplasm: Secondary | ICD-10-CM

## 2012-06-23 NOTE — Progress Notes (Signed)
Chief complaint followup laparotomy and bowel resection  History: Patient returns for more long-term followup post resection of perforated small bowel just above previous ileorectal anastomosis. He had a postoperative fluid collection a percutaneous drain which improved and his drain has been out for about a week. He and his significant other are concerned about ongoing weight loss. Overall his strength is good. He has no fever or abdominal pain. He feels his wound is improving. He is able to eat anything he wants it is eating at least as much as he was preoperatively.  Exam: BP 118/80  Pulse 92  Temp 97.1 F (36.2 C) (Temporal)  Resp 16  Ht 5\' 11"  (1.803 m)  Wt 163 lb 3.2 oz (74.027 kg)  BMI 22.76 kg/m2 Weight is down 5 pounds from 2 weeks ago  General: He appears stronger and healthier overall. Abdomen: Soft and nontender. Midline wound is well healed. I removed 2 PDS knots that seemed to be interfering with the wound closing altogether.  Assessment and plan: He appears to be making a good recovery. I feel like his weight will stabilize and increases he gets a little bit further out from the catabolic period of his surgery. He is completing his oral antibiotics and 2 days and he will stop them and call us if he has any fever or pain or other concerns. Otherwise he has an appointment for 2 weeks. He is encouraged to continue a high calorie high protein diet which she is tolerating well

## 2012-07-05 ENCOUNTER — Ambulatory Visit (INDEPENDENT_AMBULATORY_CARE_PROVIDER_SITE_OTHER): Payer: Managed Care, Other (non HMO) | Admitting: General Surgery

## 2012-07-05 ENCOUNTER — Encounter (INDEPENDENT_AMBULATORY_CARE_PROVIDER_SITE_OTHER): Payer: Self-pay | Admitting: General Surgery

## 2012-07-05 VITALS — BP 114/74 | HR 88 | Temp 97.4°F | Resp 14 | Ht 71.0 in | Wt 159.6 lb

## 2012-07-05 DIAGNOSIS — D500 Iron deficiency anemia secondary to blood loss (chronic): Secondary | ICD-10-CM

## 2012-07-05 NOTE — Progress Notes (Signed)
The patient is doing very well. Midline wound is healing well. There is no need for anymore wet-to-dry dressings  He continues to lose weight. He is down 3-4 pounds from his last visit. He is eating well. They believe as though he is having more bowel movements than usual. I recommended that he start back his loperamide.  He has some slight lower abdominal enlargement and/or swelling which does not appear to be a hernia on examination. He is not tender in that area.  He will get an upper GI endoscopy soon to assess his ulcer.his last hemoglobin was 11.0 with a white count of 7.8 and hematocrit of 34%. His platelet count was up to 453,000.  We will repeat his CBC today. However he does not require returning to this clinic unless he should have any further surgical concerns.

## 2012-07-06 ENCOUNTER — Encounter (INDEPENDENT_AMBULATORY_CARE_PROVIDER_SITE_OTHER): Payer: Self-pay

## 2012-07-12 LAB — CBC WITH DIFFERENTIAL/PLATELET
Basophils Absolute: 0 10*3/uL (ref 0.0–0.1)
HCT: 40 % (ref 39.0–52.0)
Lymphocytes Relative: 18 % (ref 12–46)
Lymphs Abs: 1.7 10*3/uL (ref 0.7–4.0)
MCV: 85.5 fL (ref 78.0–100.0)
Monocytes Absolute: 0.8 10*3/uL (ref 0.1–1.0)
Neutro Abs: 6.6 10*3/uL (ref 1.7–7.7)
RBC: 4.68 MIL/uL (ref 4.22–5.81)
RDW: 15.6 % — ABNORMAL HIGH (ref 11.5–15.5)
WBC: 9.4 10*3/uL (ref 4.0–10.5)

## 2012-07-23 ENCOUNTER — Other Ambulatory Visit: Payer: Self-pay

## 2012-08-16 ENCOUNTER — Other Ambulatory Visit: Payer: Self-pay | Admitting: Gastroenterology

## 2012-08-29 ENCOUNTER — Encounter (HOSPITAL_COMMUNITY): Payer: Self-pay | Admitting: *Deleted

## 2012-08-30 ENCOUNTER — Encounter (HOSPITAL_COMMUNITY): Payer: Self-pay | Admitting: Pharmacy Technician

## 2012-09-06 ENCOUNTER — Other Ambulatory Visit: Payer: Self-pay | Admitting: Gastroenterology

## 2012-09-06 NOTE — Addendum Note (Signed)
Addended by: Isobelle Tuckett on: 09/06/2012 05:24 PM   Modules accepted: Orders  

## 2012-09-07 ENCOUNTER — Ambulatory Visit (HOSPITAL_COMMUNITY): Payer: Managed Care, Other (non HMO) | Admitting: Anesthesiology

## 2012-09-07 ENCOUNTER — Encounter (HOSPITAL_COMMUNITY): Payer: Self-pay | Admitting: Anesthesiology

## 2012-09-07 ENCOUNTER — Ambulatory Visit (HOSPITAL_COMMUNITY)
Admission: RE | Admit: 2012-09-07 | Discharge: 2012-09-07 | Disposition: A | Discharge status: Home / Self Care | Payer: Managed Care, Other (non HMO) | Source: Ambulatory Visit | Attending: Gastroenterology | Admitting: Gastroenterology

## 2012-09-07 ENCOUNTER — Encounter (HOSPITAL_COMMUNITY): Payer: Self-pay | Admitting: *Deleted

## 2012-09-07 ENCOUNTER — Encounter (HOSPITAL_COMMUNITY)
Admission: RE | Disposition: A | Discharge status: Home / Self Care | Payer: Self-pay | Source: Ambulatory Visit | Attending: Gastroenterology

## 2012-09-07 DIAGNOSIS — K319 Disease of stomach and duodenum, unspecified: Secondary | ICD-10-CM | POA: Insufficient documentation

## 2012-09-07 HISTORY — PX: EUS: SHX5427

## 2012-09-07 HISTORY — DX: Headache: R51

## 2012-09-07 SURGERY — ESOPHAGEAL ENDOSCOPIC ULTRASOUND (EUS) RADIAL
Anesthesia: Monitor Anesthesia Care

## 2012-09-07 MED ORDER — PROPOFOL 10 MG/ML IV EMUL
INTRAVENOUS | Status: DC | PRN
  Administered 2012-09-07: 70 ug/kg/min via INTRAVENOUS

## 2012-09-07 MED ORDER — KETAMINE HCL 10 MG/ML IJ SOLN
INTRAMUSCULAR | Status: DC | PRN
  Administered 2012-09-07: 20 mg via INTRAVENOUS

## 2012-09-07 MED ORDER — FENTANYL CITRATE 0.05 MG/ML IJ SOLN
INTRAMUSCULAR | Status: DC | PRN
Start: 2012-09-07 — End: 2012-09-07
  Administered 2012-09-07: 100 ug via INTRAVENOUS

## 2012-09-07 MED ORDER — MIDAZOLAM HCL 5 MG/5ML IJ SOLN
INTRAMUSCULAR | Status: DC | PRN
  Administered 2012-09-07: 2 mg via INTRAVENOUS

## 2012-09-07 MED ORDER — SODIUM CHLORIDE 0.9 % IV SOLN
INTRAVENOUS | Status: DC
Start: 2012-09-07 — End: 2012-09-07

## 2012-09-07 MED ORDER — BUTAMBEN-TETRACAINE-BENZOCAINE 2-2-14 % EX AERO
INHALATION_SPRAY | CUTANEOUS | Status: DC | PRN
  Administered 2012-09-07: 2 via TOPICAL

## 2012-09-07 MED ORDER — LACTATED RINGERS IV SOLN
INTRAVENOUS | Status: DC
  Administered 2012-09-07: 1000 mL via INTRAVENOUS

## 2012-09-07 NOTE — Op Note (Signed)
[facility] [address] [place], [address]   ENDOSCOPIC ULTRASOUND PROCEDURE REPORT  PATIENT: Gabriel Lee, Gabriel Lee  MR#: [id number] BIRTHDATE: [birth year]  GENDER: Male ENDOSCOPIST: Willis Modena, MD REFERRED BY:  Bernette Redbird, M.D. PROCEDURE DATE:  09/07/2012 PROCEDURE:   Upper EUS ASA CLASS:      Class II INDICATIONS:   1.  gastric nodule. MEDICATIONS: MAC sedation, administered by CRNA and Cetacaine spray x 2  DESCRIPTION OF PROCEDURE:   After the risks benefits and alternatives of the procedure were  explained, informed consent was obtained. The patient was then placed in the left, lateral, decubitus postion and IV sedation was administered. Throughout the procedure, the patients blood pressure, pulse and oxygen saturations were monitored continuously.  Under direct visualization, the     endoscope was introduced through the mouth and advanced to the stomach antrum .  Water was used as necessary to provide an acoustic interface.  Upon completion of the imaging, water was removed and the patient was sent to the recovery room in satisfactory condition.   FINDINGS:      Small submucosal-appearing nodule in gastric antrum with central umbilication.  Stomach gently instilled with water to facilitate acoustic coupling.  Nodule readily appreciated.  Is submucosal in origin, but does not extend to muscularis propria. Has  "salt-and-pepper" echotexture.  Measures maximally 5 x 7mm in size, and appears to extend to the deep mucosa.  Lesion too small and too mobile for any hope at successful FNA biopsies.  Mucosal biopsies with cold forceps obtained to complete procedure.   Water suctioned out through endoscope as best possible to complete the procedure.  IMPRESSION:     As above.  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  RECOMMENDATIONS:     1.  Watch for potential complications of procedure. 2.  Await biopsy results. 3.  If biopsies are  benign, as I strongly suspect, would likely not pursue any further surveillance of this gastric nodule. 4.  Will discuss with Dr. Matthias Hughs.   _______________________________ eSigned:  Willis Modena, MD 09/07/2012 10:10 AM   CC:

## 2012-09-07 NOTE — Transfer of Care (Signed)
Immediate Anesthesia Transfer of Care Note  Patient: Gabriel Lee  Procedure(s) Performed: Procedure(s): ESOPHAGEAL ENDOSCOPIC ULTRASOUND (EUS) RADIAL (N/A)  Patient Location: PACU  Anesthesia Type:MAC  Level of Consciousness: sedated  Airway & Oxygen Therapy: Patient Spontanous Breathing and Patient connected to nasal cannula oxygen  Post-op Assessment: Report given to PACU RN and Post -op Vital signs reviewed and stable  Post vital signs: Reviewed and stable  Complications: No apparent anesthesia complications

## 2012-09-07 NOTE — Anesthesia Preprocedure Evaluation (Signed)
Anesthesia Evaluation  Patient identified by MRN, date of birth, ID band Patient awake    Reviewed: Allergy & Precautions, H&P , NPO status , Patient's Chart, lab work & pertinent test results  Airway Mallampati: II TM Distance: >3 FB Neck ROM: Full    Dental  (+) Teeth Intact and Dental Advisory Given   Pulmonary neg pulmonary ROS,  breath sounds clear to auscultation  Pulmonary exam normal       Cardiovascular negative cardio ROS  Rhythm:Regular Rate:Normal     Neuro/Psych negative neurological ROS  negative psych ROS   GI/Hepatic Neg liver ROS, PUD, Hx GI bleed requiring blood transfusion   Endo/Other  negative endocrine ROS  Renal/GU negative Renal ROS  negative genitourinary   Musculoskeletal negative musculoskeletal ROS (+)   Abdominal   Peds  Hematology negative hematology ROS (+)   Anesthesia Other Findings   Reproductive/Obstetrics negative OB ROS                           Anesthesia Physical Anesthesia Plan  ASA: II  Anesthesia Plan: MAC   Post-op Pain Management:    Induction: Intravenous  Airway Management Planned: Nasal Cannula  Additional Equipment:   Intra-op Plan:   Post-operative Plan:   Informed Consent: I have reviewed the patients History and Physical, chart, labs and discussed the procedure including the risks, benefits and alternatives for the proposed anesthesia with the patient or authorized representative who has indicated his/her understanding and acceptance.   Dental advisory given  Plan Discussed with: CRNA  Anesthesia Plan Comments:         Anesthesia Quick Evaluation

## 2012-09-07 NOTE — H&P (Signed)
Patient interval history reviewed.  Patient examined again.  There has been no change from documented H/P dated 09/06/12 (scanned into chart from our office) except as documented above.  Assessment:  1.  Gastric nodule.  Plan:  1.  Endoscopic ultrasound with possible fine needle aspiration biopsies. 2.  Risks (bleeding, infection, bowel perforation that could require surgery, sedation-related changes in cardiopulmonary systems), benefits (identification and possible treatment of source of symptoms, exclusion of certain causes of symptoms), and alternatives (watchful waiting, radiographic imaging studies, empiric medical treatment) of upper endoscopy with ultrasound and possible biopsies (EUS +/- FNA) were explained to patient in detail and patient wishes to proceed.

## 2012-09-07 NOTE — Anesthesia Postprocedure Evaluation (Signed)
Anesthesia Post Note  Patient: Gabriel Lee  Procedure(s) Performed: Procedure(s) (LRB): ESOPHAGEAL ENDOSCOPIC ULTRASOUND (EUS) RADIAL (N/A)  Anesthesia type: MAC  Patient location: PACU  Post pain: Pain level controlled  Post assessment: Post-op Vital signs reviewed  Last Vitals:  Filed Vitals:   09/07/12 1044  BP: 114/76  Pulse:   Temp:   Resp:     Post vital signs: Reviewed  Level of consciousness: sedated  Complications: No apparent anesthesia complications

## 2012-09-08 ENCOUNTER — Encounter (HOSPITAL_COMMUNITY): Payer: Self-pay | Admitting: Gastroenterology

## 2013-04-13 ENCOUNTER — Other Ambulatory Visit: Payer: Self-pay

## 2013-09-12 IMAGING — CT CT ABD-PELV W/ CM
3 of 5 series · 12 of 36 positions shown, 18 images · IV contrast (READICAT/WATER & [ID] OMNI 300)
Comparison: 06/03/2012 and 06/01/2012.

CLINICAL DATA: Follow-up percutaneous drainage catheter.

CT ABDOMEN AND PELVIS WITH CONTRAST
TECHNIQUE: Multidetector CT imaging of the abdomen and pelvis was
performed following the standard protocol during bolus
administration of intravenous contrast.
Contrast: 100mL OMNIPAQUE IOHEXOL 300 MG/ML  SOLN

[Series 3: abd/pelvis with · axial · 0.70mm/px · z∈[-402,-52]mm · 8 of 90 slices shown, 13 images]
[im 10/90  soft-tissue]
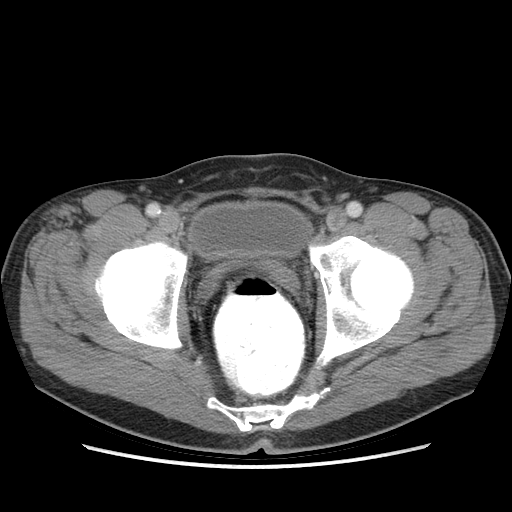
[im 10/90  bone]
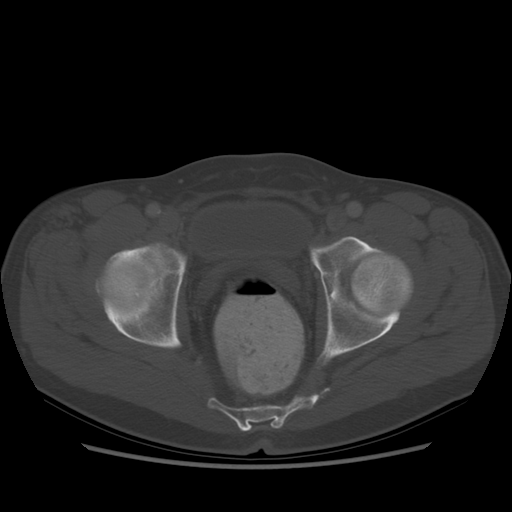
[im 20/90  soft-tissue]
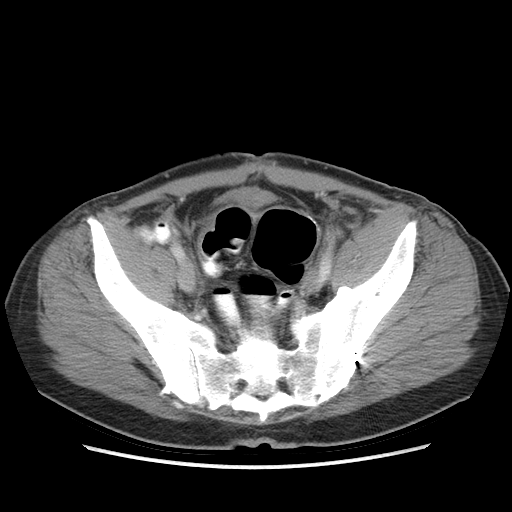
[im 30/90  soft-tissue]
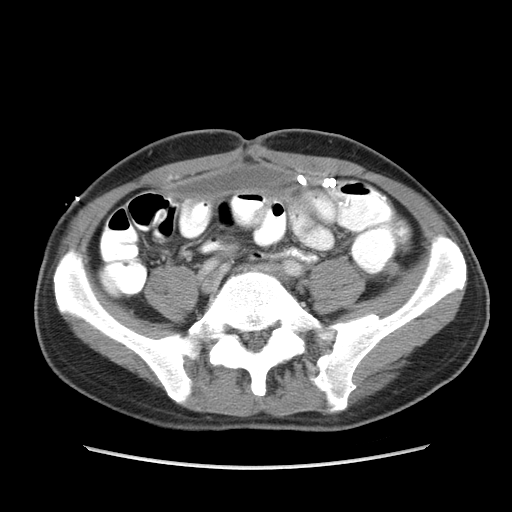
[im 40/90  soft-tissue]
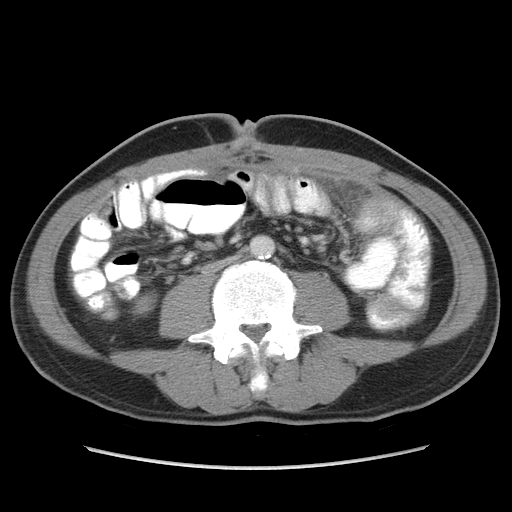
[im 50/90  soft-tissue]
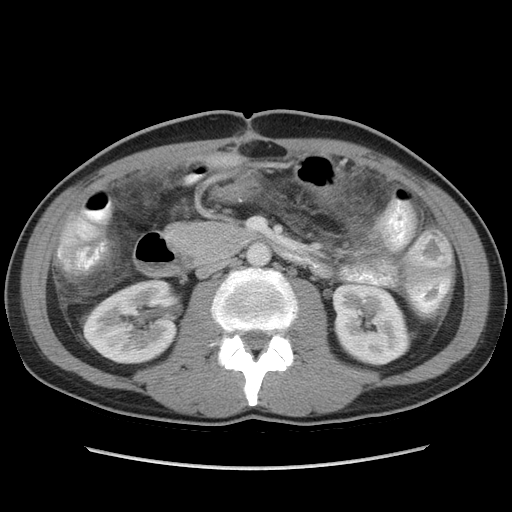
[im 50/90  lung]
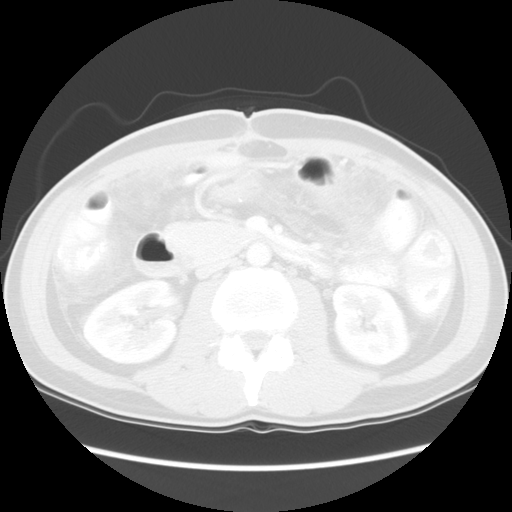
[im 60/90  soft-tissue]
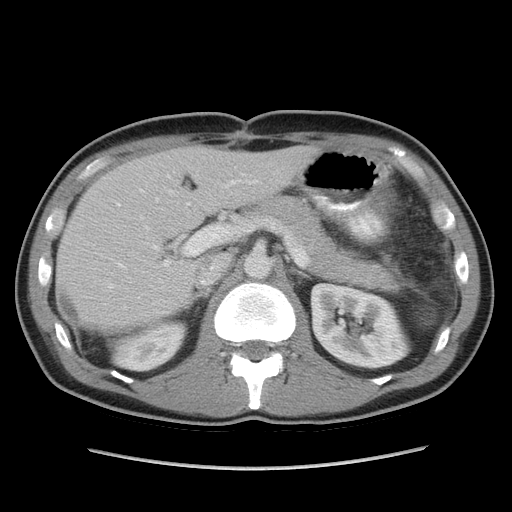
[im 60/90  lung]
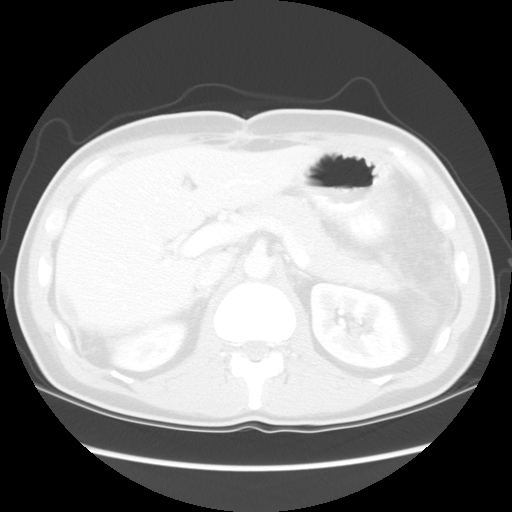
[im 70/90  soft-tissue]
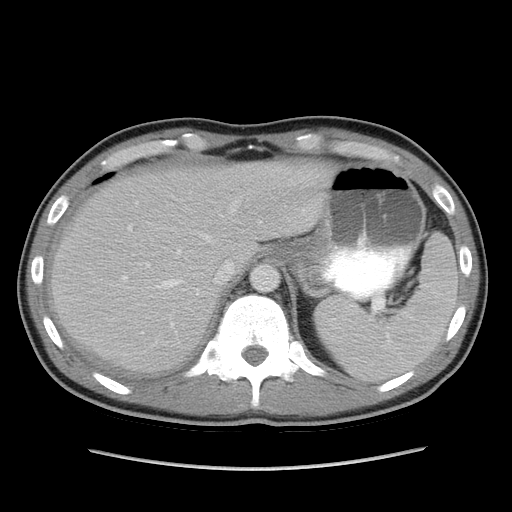
[im 70/90  lung]
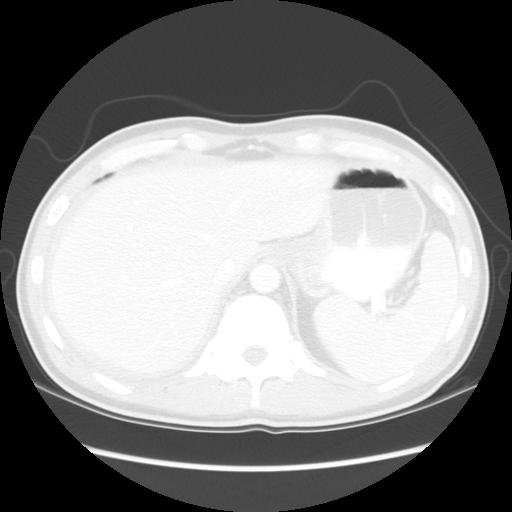
[im 80/90  soft-tissue]
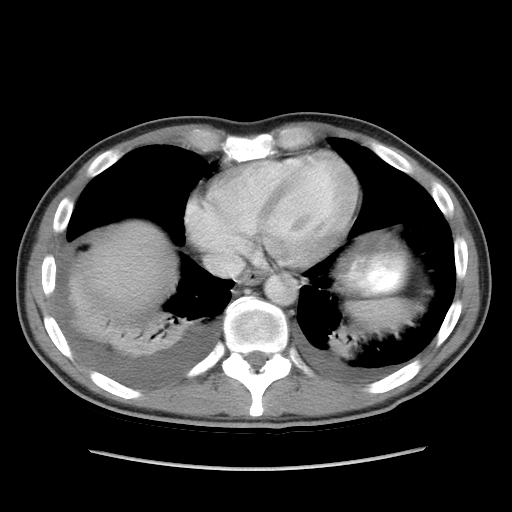
[im 80/90  lung]
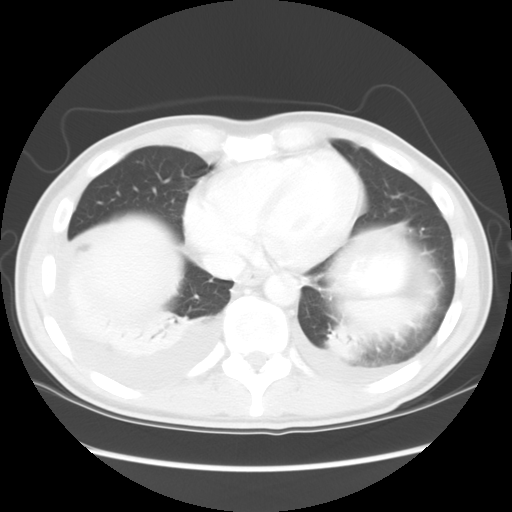

[Series 601: coronal body · coronal · 0.88mm/px · 1 of 101 slices shown, 2 images]
[im 34/101  soft-tissue]
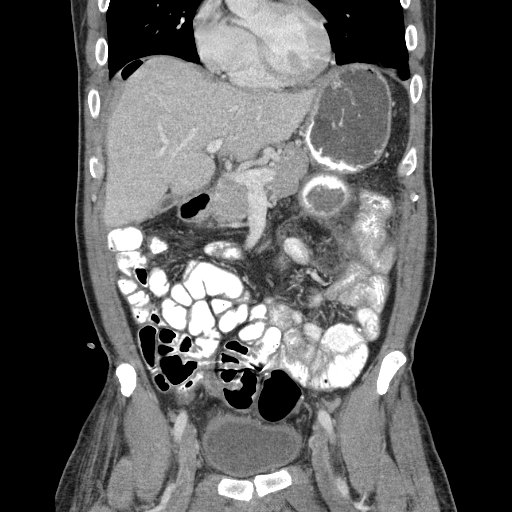
[im 34/101  bone]
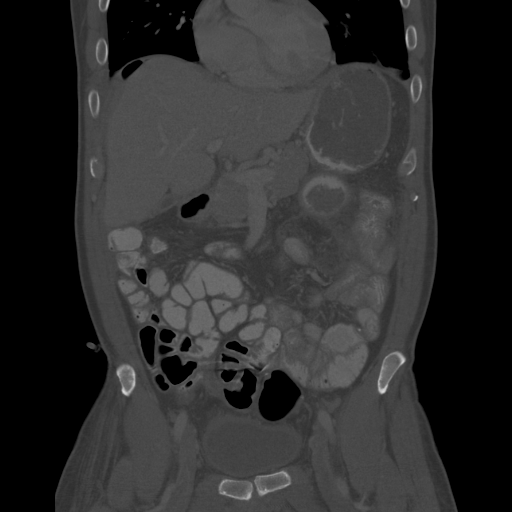

[Series 602: sagittal body · sagittal · 0.88mm/px · 3 of 145 slices shown]
[im 10/145  soft-tissue]
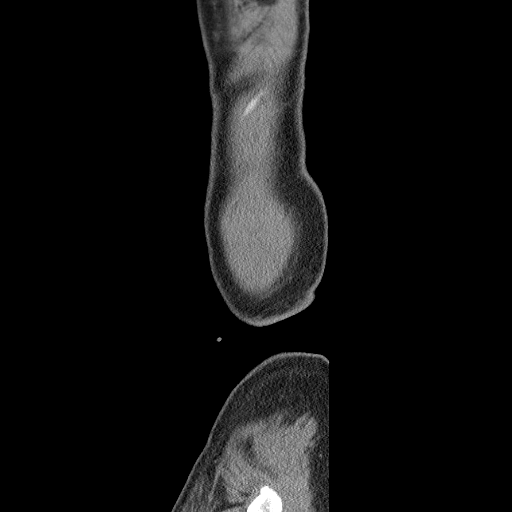
[im 29/145  soft-tissue]
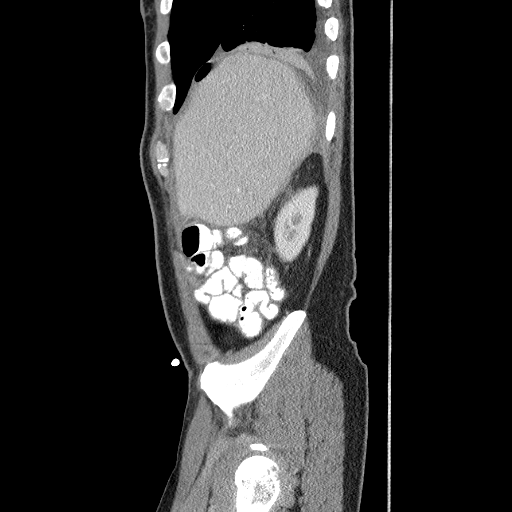
[im 49/145  soft-tissue]
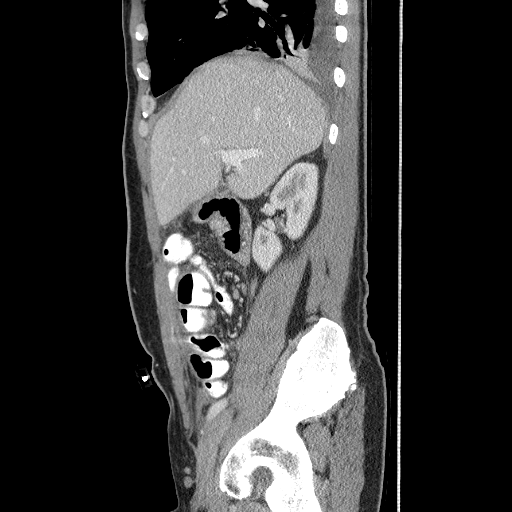

[12 of 36 positions shown; findings below may reference images not displayed]

FINDINGS: The lung bases demonstrate small bilateral pleural
effusions and overlying atelectasis.

There is still some fluid around the liver with a small focal fluid
collection along the right lateral aspect of the liver.  Continued
follow up is recommended.  The small calcified granuloma is noted
in the liver otherwise no intrahepatic abnormalities.  The portal
and hepatic veins are patent.  The gallbladder is contracted.  No
common bile duct dilatation.  The pancreas is normal and stable.
The spleen is normal in size.  No focal lesions.  The adrenal
glands and kidneys are unremarkable.

There is a drainage catheter along the lower and left lateral
margin of the anterior intra-abdominal fluid collection.  The
catheter may need to be slightly repositioned.  The fluid
collection measures approximately 11 by 8 cm.  No new fluid
collections are identified.

The stomach, duodenum, small bowel and colon are grossly normal.
No findings for obstruction or leaking oral contrast.  Mild left
upper quadrant small bowel wall thickening may be due to
surrounding mesenteric fluid and edema.  There are persistent small
scattered mesenteric and retroperitoneal lymph nodes but no mass or
overt adenopathy.

The bladder, prostate gland and seminal vesicles are unremarkable.
No pelvic mass or adenopathy.  No inguinal mass or hernia.

The bony structures are intact.
IMPRESSION: 1.  Persistent but definitely smaller anterior intra abdominal
fluid collection.  The drainage catheter is along the lower and
lateral aspect of the fluid collection and may need to be slightly
repositioned.  Findings discussed with Dr. Amazigh.
2.  Small residual fluid collection along the lateral aspect of the
liver.
3.  Small bilateral pleural effusions and overlying atelectasis.
4.  Persistent areas of mesenteric edema and fluid but overall
significantly improved.

## 2014-04-30 ENCOUNTER — Other Ambulatory Visit: Payer: Self-pay | Admitting: Gastroenterology

## 2014-04-30 DIAGNOSIS — R4702 Dysphasia: Secondary | ICD-10-CM

## 2014-05-01 ENCOUNTER — Ambulatory Visit
Admission: RE | Admit: 2014-05-01 | Discharge: 2014-05-01 | Disposition: A | Discharge status: Home / Self Care | Payer: Managed Care, Other (non HMO) | Source: Ambulatory Visit | Attending: Gastroenterology | Admitting: Gastroenterology

## 2014-05-01 DIAGNOSIS — R4702 Dysphasia: Secondary | ICD-10-CM

## 2014-06-21 ENCOUNTER — Encounter (HOSPITAL_COMMUNITY): Payer: Self-pay | Admitting: Gastroenterology

## 2017-03-22 ENCOUNTER — Encounter: Payer: Self-pay | Admitting: Oncology

## 2017-04-22 ENCOUNTER — Ambulatory Visit (HOSPITAL_BASED_OUTPATIENT_CLINIC_OR_DEPARTMENT_OTHER): Payer: BLUE CROSS/BLUE SHIELD | Admitting: Oncology

## 2017-04-22 ENCOUNTER — Encounter: Payer: Self-pay | Admitting: Oncology

## 2017-04-22 VITALS — BP 132/86 | HR 52 | Temp 97.8°F | Resp 20 | Ht 71.0 in | Wt 196.7 lb

## 2017-04-22 DIAGNOSIS — Z85048 Personal history of other malignant neoplasm of rectum, rectosigmoid junction, and anus: Secondary | ICD-10-CM | POA: Diagnosis not present

## 2017-04-22 DIAGNOSIS — C187 Malignant neoplasm of sigmoid colon: Secondary | ICD-10-CM
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
## 2017-04-25 ENCOUNTER — Telehealth: Payer: Self-pay

## 2017-04-25 NOTE — Telephone Encounter (Signed)
Called patient per 11/16 inbasket and patient declines to make genetics appt at this time  Gabriel Lee

## 2017-04-30 ENCOUNTER — Telehealth: Payer: Self-pay | Admitting: Oncology

## 2017-04-30 NOTE — Telephone Encounter (Signed)
Per 11/15 los - F/u TBA

## 2017-05-11 ENCOUNTER — Telehealth: Payer: Self-pay | Admitting: *Deleted

## 2017-05-11 NOTE — Telephone Encounter (Signed)
Message from pt's wife requesting visit be re-coded as a "routine visit." Pt was seen as a new patient consult. Reviewed with Dr. Benay Spice, MD requested we submit to billing to determine if a change can be made.  Message forwarded to [facility] coding dept. Spoke with Freda Munro, she will look into request.

## 2017-05-20 NOTE — Telephone Encounter (Signed)
Call from pt's wife to follow up coding request. Informed her the issue has been sent to our coding dept. I have not heard back about the resolution. Informed her that I will follow up. She was appreciative of the return call.

## 2017-07-29 ENCOUNTER — Encounter (HOSPITAL_COMMUNITY): Payer: Self-pay | Admitting: *Deleted

## 2017-07-29 ENCOUNTER — Other Ambulatory Visit: Payer: Self-pay

## 2017-07-29 ENCOUNTER — Ambulatory Visit (HOSPITAL_COMMUNITY)
Admission: EM | Admit: 2017-07-29 | Discharge: 2017-07-29 | Disposition: A | Discharge status: Home / Self Care | Payer: BLUE CROSS/BLUE SHIELD | Attending: Emergency Medicine | Admitting: Emergency Medicine

## 2017-07-29 ENCOUNTER — Encounter (HOSPITAL_COMMUNITY)
Admission: EM | Disposition: A | Discharge status: Home / Self Care | Payer: Self-pay | Source: Home / Self Care | Attending: Emergency Medicine

## 2017-07-29 DIAGNOSIS — T18128A Food in esophagus causing other injury, initial encounter: Secondary | ICD-10-CM | POA: Diagnosis present

## 2017-07-29 DIAGNOSIS — Z886 Allergy status to analgesic agent status: Secondary | ICD-10-CM | POA: Insufficient documentation

## 2017-07-29 DIAGNOSIS — K222 Esophageal obstruction: Secondary | ICD-10-CM | POA: Diagnosis not present

## 2017-07-29 DIAGNOSIS — K2970 Gastritis, unspecified, without bleeding: Secondary | ICD-10-CM | POA: Insufficient documentation

## 2017-07-29 DIAGNOSIS — X58XXXA Exposure to other specified factors, initial encounter: Secondary | ICD-10-CM | POA: Insufficient documentation

## 2017-07-29 DIAGNOSIS — K200 Eosinophilic esophagitis: Secondary | ICD-10-CM | POA: Diagnosis not present

## 2017-07-29 DIAGNOSIS — Z85038 Personal history of other malignant neoplasm of large intestine: Secondary | ICD-10-CM | POA: Insufficient documentation

## 2017-07-29 DIAGNOSIS — Z888 Allergy status to other drugs, medicaments and biological substances status: Secondary | ICD-10-CM | POA: Diagnosis not present

## 2017-07-29 HISTORY — PX: ESOPHAGOGASTRODUODENOSCOPY (EGD) WITH PROPOFOL: SHX5813

## 2017-07-29 LAB — I-STAT CHEM 8, ED
BUN: 11 mg/dL (ref 6–20)
CALCIUM ION: 1.13 mmol/L — AB (ref 1.15–1.40)
CHLORIDE: 102 mmol/L (ref 101–111)
CREATININE: 1 mg/dL (ref 0.61–1.24)
GLUCOSE: 88 mg/dL (ref 65–99)
HCT: 46 % (ref 39.0–52.0)
Hemoglobin: 15.6 g/dL (ref 13.0–17.0)
POTASSIUM: 3.9 mmol/L (ref 3.5–5.1)
Sodium: 141 mmol/L (ref 135–145)
TCO2: 27 mmol/L (ref 22–32)

## 2017-07-29 SURGERY — ESOPHAGOGASTRODUODENOSCOPY (EGD) WITH PROPOFOL
Anesthesia: Moderate Sedation | Laterality: Left

## 2017-07-29 MED ORDER — DIPHENHYDRAMINE HCL 50 MG/ML IJ SOLN
INTRAMUSCULAR | Status: AC
  Filled 2017-07-29: qty 1

## 2017-07-29 MED ORDER — SODIUM CHLORIDE 0.9 % IV BOLUS (SEPSIS)
1000.0000 mL | Freq: Once | INTRAVENOUS | Status: AC
  Administered 2017-07-29: 1000 mL via INTRAVENOUS

## 2017-07-29 MED ORDER — FENTANYL CITRATE (PF) 100 MCG/2ML IJ SOLN
INTRAMUSCULAR | Status: AC
  Filled 2017-07-29: qty 4

## 2017-07-29 MED ORDER — FENTANYL CITRATE (PF) 100 MCG/2ML IJ SOLN
INTRAMUSCULAR | Status: DC | PRN
  Administered 2017-07-29 (×3): 25 ug via INTRAVENOUS

## 2017-07-29 MED ORDER — MIDAZOLAM HCL 5 MG/5ML IJ SOLN
INTRAMUSCULAR | Status: DC | PRN
  Administered 2017-07-29 (×4): 2 mg via INTRAVENOUS

## 2017-07-29 MED ORDER — MIDAZOLAM HCL 5 MG/ML IJ SOLN
INTRAMUSCULAR | Status: AC
  Filled 2017-07-29: qty 3

## 2017-07-29 SURGICAL SUPPLY — 14 items

## 2017-07-29 NOTE — ED Notes (Signed)
Pt friend Oretha Caprice called by this RN per pt request. No answer, voicemail left for pt

## 2017-07-29 NOTE — Progress Notes (Signed)
Bedside EGD done at bedside with Dr. Paulita Fujita. 8mg  Versed and 75 mcg given during procedure. VS stable during case. Biopsies taken.   Post-procedure, pt is sleeping, responds to voice. VS are stable. Report given to Verlin Fester RN.

## 2017-07-29 NOTE — Discharge Instructions (Addendum)
Endoscopy °Care After °Please read the instructions outlined below and refer to this sheet in the next few weeks. These discharge instructions provide you with general information on caring for yourself after you leave the hospital. Your doctor may also give you specific instructions. While your treatment has been planned according to the most current medical practices available, unavoidable complications occasionally occur. If you have any problems or questions after discharge, please call Dr. Outlaw ([facility]) at [phone]. ° °HOME CARE INSTRUCTIONS °Activity °· You may resume your regular activity but move at a slower pace for the next 24 hours.  °· Take frequent rest periods for the next 24 hours.  °· Walking will help expel (get rid of) the air and reduce the bloated feeling in your abdomen.  °· No driving for 24 hours (because of the anesthesia (medicine) used during the test).  °· You may shower.  °· Do not sign any important legal documents or operate any machinery for 24 hours (because of the anesthesia used during the test).  °Nutrition °· Drink plenty of fluids.  °· You may resume your normal diet.  °· Begin with a light meal and progress to your normal diet.  °· Avoid alcoholic beverages for 24 hours or as instructed by your caregiver.  °Medications °You may resume your normal medications unless your caregiver tells you otherwise. °What you can expect today °· You may experience abdominal discomfort such as a feeling of fullness or "gas" pains.  °· You may experience a sore throat for 2 to 3 days. This is normal. Gargling with salt water may help this.  °·  °SEEK IMMEDIATE MEDICAL CARE IF: °· You have excessive nausea (feeling sick to your stomach) and/or vomiting.  °· You have severe abdominal pain and distention (swelling).  °· You have trouble swallowing.  °· You have a temperature over 100° F (37.8° C).  °· You have rectal bleeding or vomiting of blood.  °Document Released:  01/07/2004 Document Revised: 02/04/2011 Document Reviewed: 07/20/2007 °ExitCare® Patient Information ©2012 [employer]. °

## 2017-07-29 NOTE — ED Notes (Signed)
ED Provider at bedside. 

## 2017-07-29 NOTE — ED Notes (Signed)
Pt titrated down to 1L [place] O2, sats remained at 98%. Pt arousable to loud voice or noxious stimuli. Will continue to monitor.

## 2017-07-29 NOTE — ED Notes (Addendum)
Pt removed from [facility]. Pt alert to voice.

## 2017-07-29 NOTE — ED Triage Notes (Addendum)
Pt has food stuck at bottom of esophagus and was told to come to the ED to have on call gastro called, may need to be scoped to remove. Dr. Cristina Gong.  Pt states if he eats or drinks the contents come back up. Pt is alert and airway intact.  No distress

## 2017-07-29 NOTE — ED Notes (Signed)
Gastro at the bedside

## 2017-07-29 NOTE — Consult Note (Signed)
[facility] Consultation Note  Referring Provider: Dr. Jeanell Sparrow (Emergency Department) Primary Care Physician:  Patient, No Pcp Per Primary Gastroenterologist:  Dr. Ronald Lobo  Reason for Consultation:  Suspected esophageal foreign body  HPI: Gabriel Lee is a 57 y.o. male presenting for suspected esophageal food impaction.  He has intermittent history of dysphagia for many years, last night he ate pork and this became lodged in subxiphoid region worrisome for impaction at GE junction or distal esophagus.  He has no abdominal pain.  No sialorrhea, but does have emesis when he tries to drink liquids or further solids.  No hematemesis, melena, hematochezia.   Past Medical History:  Diagnosis Date  . Anemia   . Anemia due to GI blood loss 06/21/2012  . Blood transfusion without reported diagnosis   . Colon cancer ([place])   . Headache(784.0)    sinus  . Intra-abdominal abscess ([place]) 06/21/2012  . Upper GI bleeding 06/21/2012    Past Surgical History:  Procedure Laterality Date  . ABDOMINAL EXPLORATION SURGERY    . COLON SURGERY    . EUS N/A 09/07/2012   Procedure: ESOPHAGEAL ENDOSCOPIC ULTRASOUND (EUS) RADIAL;  Surgeon: Arta Silence, MD;  [facility];  Service: Endoscopy;  Laterality: N/A;  . LAPAROTOMY  05/26/2012   Procedure: EXPLORATORY LAPAROTOMY;  Surgeon: Gwenyth Ober, MD;  Location: [facility];  Service: General;  Laterality: N/A;  with resection of Jejunal polyp and enterolysis  . SMALL INTESTINE SURGERY  2013  . TONSILLECTOMY      Prior to Admission medications   Medication Sig Start Date End Date Taking? Authorizing Provider  ASTRAGALUS PO Take 2 tablets by mouth as needed (for immune system).   Yes [provider]    Current Facility-Administered Medications  Medication Dose Route Frequency Provider Last Rate Last Dose  . sodium chloride 0.9 % bolus 1,000 mL  1,000 mL Intravenous Once Pattricia Boss, MD 1,000 mL/hr at 07/29/17 1205 1,000 mL at  07/29/17 1205   Current Outpatient Medications  Medication Sig Dispense Refill  . ASTRAGALUS PO Take 2 tablets by mouth as needed (for immune system).      Allergies as of 07/29/2017 - Review Complete 07/29/2017  Allergen Reaction Noted  . Nsaids Other (See Comments) 08/30/2012  . Oxaliplatin Anaphylaxis 04/22/2017    Family History  Problem Relation Age of Onset  . Breast cancer Sister   . Breast cancer Maternal Grandmother   . Bladder Cancer Unknown     Social History   Socioeconomic History  . Marital status: Married    Spouse name: Not on file  . Number of children: Not on file  . Years of education: Not on file  . Highest education level: Not on file  Social Needs  . Financial resource strain: Not on file  . Food insecurity - worry: Not on file  . Food insecurity - inability: Not on file  . Transportation needs - medical: Not on file  . Transportation needs - non-medical: Not on file  Occupational History  . Not on file  Tobacco Use  . Smoking status: Never Smoker  . Smokeless tobacco: Never Used  Substance and Sexual Activity  . Alcohol use: Yes    Comment: occasionally  . Drug use: No  . Sexual activity: Not on file  Other Topics Concern  . Not on file  Social History Narrative  . Not on file    Review of Systems: As per HPI, all others negative. Physical Exam: Vital signs  in last 24 hours: Temp:  [98.2 F (36.8 C)] 98.2 F (36.8 C) (02/21 0936) Pulse Rate:  [64-89] 89 (02/21 1115) Resp:  [16] 16 (02/21 0936) BP: (110-124)/(90-98) 124/91 (02/21 1115) SpO2:  [97 %-99 %] 97 % (02/21 1115)   General:   Alert,  Well-developed, well-nourished, pleasant and cooperative in NAD Head:  Normocephalic and atraumatic. Eyes:  Sclera clear, no icterus.   Conjunctiva pink. Ears:  Normal auditory acuity. Nose:  No deformity, discharge,  or lesions. Mouth:  No deformity or lesions.  Oropharynx pink & moist. Neck:  Supple; no masses or thyromegaly. Lungs:   Clear throughout to auscultation.   No wheezes, crackles, or rhonchi. No acute distress. Heart:  Regular rate and rhythm; no murmurs, clicks, rubs,  or gallops. Abdomen:  Soft, nontender and nondistended. No masses, hepatosplenomegaly or hernias noted. Normal bowel sounds, without guarding, and without rebound.     Msk:  Symmetrical without gross deformities. Normal posture. Pulses:  Normal pulses noted. Extremities:  Without clubbing or edema. Neurologic:  Alert and  oriented x4;  grossly normal neurologically. Skin:  Intact without significant lesions or rashes. Psych:  Alert and cooperative. Normal mood and affect.   Lab Results: Recent Labs    07/29/17 1103  HGB 15.6  HCT 46.0   BMET Recent Labs    07/29/17 1103  NA 141  K 3.9  CL 102  GLUCOSE 88  BUN 11  CREATININE 1.00   LFT No results for input(s): PROT, ALBUMIN, AST, ALT, ALKPHOS, BILITOT, BILIDIR, IBILI in the last 72 hours. PT/INR No results for input(s): LABPROT, INR in the last 72 hours.  Studies/Results: No results found.  Impression:  1.  Intermittent dysphagia for years. 2.  Suspected esophageal food impaction, from pork eaten ~ 24 hours ago.  Plan:  1.  Endoscopy now for possible esophageal foreign body removal. 2.  Risks (bleeding, infection, bowel perforation that could require surgery, sedation-related changes in cardiopulmonary systems), benefits (identification and possible treatment of source of symptoms, exclusion of certain causes of symptoms), and alternatives (watchful waiting, radiographic imaging studies, empiric medical treatment) of upper endoscopy (EGD) were explained to patient/family in detail and patient wishes to proceed. 3.  Patient can NOT drive or operate heavy machinery until tomorrow morning 0800.  He can not drive himself home after procedure, he can not get cab or ride-sharing driver to take him home.  He must go home with someone who can take him home and go inside the home with  him for several hours.   I have communicated this directly with patient and his nurse before we start the sedation process   LOS: 0 days   Kierstynn Babich M  07/29/2017, 12:39 PM  Cell [phone] If no answer or after 5 PM call [phone]

## 2017-07-29 NOTE — ED Notes (Addendum)
Informed consent signed by Dr Paulita Fujita and witnessed by Charlyn Minerva. Placed in medical records. Per RN, 8 mg versed and 75 mcg of fentanyl during procedure. Pt on 2L at this time

## 2017-07-29 NOTE — ED Provider Notes (Addendum)
[facility] EMERGENCY DEPARTMENT Provider Note   CSN: [id number] Arrival date & time: 07/29/17  3825     History   Chief Complaint Chief Complaint  Patient presents with  . Food stuck in esophagus    HPI Gabriel Lee is a 57 y.o. male.  HPI 57 year old man history of colon cancer, history of esophageal stricture, history of esophageal food impaction presents today complaining of esophageal food impaction.  He states he ate pulled pork yesterday at lunch.  Since then he has been unable to swallow anything down including water.  He is not complaining of pain.  He states this is similar to prior episodes.  He has had only one episode of esophageal impaction in the past, but has known strictures and has required dilation.  He states his last infection was 2013.  He is a patient of Dr. Buccini's.  He called the office and was told to come to the ED to be seen by the gastroenterologist, in the hospital for [facility] gastroenterology. Past Medical History:  Diagnosis Date  . Anemia   . Anemia due to GI blood loss 06/21/2012  . Blood transfusion without reported diagnosis   . Colon cancer ([place])   . Headache(784.0)    sinus  . Intra-abdominal abscess ([facility]) 06/21/2012  . Upper GI bleeding 06/21/2012    Patient Active Problem List   Diagnosis Date Noted  . Upper GI bleeding 06/21/2012  . Intra-abdominal abscess ([place]) 06/21/2012  . Anemia due to GI blood loss 06/21/2012  . Postop check 06/09/2012  . Perforated intestine ([place]) 05/30/2012    Past Surgical History:  Procedure Laterality Date  . ABDOMINAL EXPLORATION SURGERY    . COLON SURGERY    . EUS N/A 09/07/2012   Procedure: ESOPHAGEAL ENDOSCOPIC ULTRASOUND (EUS) RADIAL;  Surgeon: Arta Silence, MD;  [facility];  Service: Endoscopy;  Laterality: N/A;  . LAPAROTOMY  05/26/2012   Procedure: EXPLORATORY LAPAROTOMY;  Surgeon: Gwenyth Ober, MD;  Location: [facility];  Service: General;  Laterality: N/A;  with  resection of Jejunal polyp and enterolysis  . SMALL INTESTINE SURGERY  2013  . TONSILLECTOMY         Home Medications    Prior to Admission medications   Not on File    Family History Family History  Problem Relation Age of Onset  . Breast cancer Sister   . Breast cancer Maternal Grandmother   . Bladder Cancer Unknown     Social History Social History   Tobacco Use  . Smoking status: Never Smoker  . Smokeless tobacco: Never Used  Substance Use Topics  . Alcohol use: Yes    Comment: occasionally  . Drug use: No     Allergies   Nsaids and Oxaliplatin   Review of Systems Review of Systems  All other systems reviewed and are negative.    Physical Exam Updated Vital Signs BP (!) 110/98 (BP Location: Right Arm)   Pulse 87   Temp 98.2 F (36.8 C) (Oral)   Resp 16   SpO2 99%   Physical Exam  Constitutional: He is oriented to person, place, and time. He appears well-developed and well-nourished.  HENT:  Head: Normocephalic and atraumatic.  Right Ear: External ear normal.  Left Ear: External ear normal.  Eyes: EOM are normal. Pupils are equal, round, and reactive to light.  Neck: Normal range of motion.  Cardiovascular: Normal rate and regular rhythm.  Pulmonary/Chest: Effort normal and breath sounds normal.  Abdominal:  Soft. Bowel sounds are normal.  Musculoskeletal: Normal range of motion.  Neurological: He is alert and oriented to person, place, and time.  Skin: Skin is warm and dry.  Psychiatric: He has a normal mood and affect.  Nursing note and vitals reviewed.    ED Treatments / Results  Labs (all labs ordered are listed, but only abnormal results are displayed) Labs Reviewed - No data to display  EKG  EKG Interpretation None       Radiology No results found.  Procedures Procedures (including critical care time)  Medications Ordered in ED Medications - No data to display   Initial Impression / Assessment and Plan / ED Course    I have reviewed the triage vital signs and the nursing notes.  Pertinent labs & imaging results that were available during my care of the patient were reviewed by me and considered in my medical decision making (see chart for details).    11:13 AM  Discussed with Dr. Paulita Fujita.  Patient appears stable.  Dr. states it may be 3-4 hours for they were able to get him into their schedule. Patient updated. Endo at bedside Final Clinical Impressions(s) / ED Diagnoses   Final diagnoses:  Esophageal obstruction due to food impaction    ED Discharge Orders    None       Pattricia Boss, MD 07/29/17 1113    Pattricia Boss, MD 07/29/17 1228

## 2017-07-29 NOTE — ED Notes (Signed)
Endoscopy being performed by the bedside at this time. Per pt, he has no one to pick him up after procedure as his wife is in [place]. This RN spoke with Melissa, CN who spoke with [facility]. Pt recover from sedation here in the ED before discharged back home.

## 2017-07-29 NOTE — Op Note (Signed)
[facility] Patient Name: Gabriel Lee Procedure Date : 07/29/2017 MRN: [id number] Attending MD: Arta Silence , MD Date of Birth: September 13, 1960 CSN: [id number] Age: 57 Admit Type: Emergency Department Procedure:                Upper GI endoscopy Indications:              Foreign body in the esophagus, Esophageal dysphagia Providers:                Arta Silence, MD, Angus Seller, Danford Bad,                            Technician, Charolette Child, Technician Referring MD:              Medicines:                Fentanyl 75 micrograms IV, Midazolam 8 mg IV,                            Cetacaine spray Complications:            No immediate complications. Estimated Blood Loss:     Estimated blood loss was minimal. Procedure:                Pre-Anesthesia Assessment:                           - Prior to the procedure, a History and Physical                            was performed, and patient medications and                            allergies were reviewed. The patient's tolerance of                            previous anesthesia was also reviewed. The risks                            and benefits of the procedure and the sedation                            options and risks were discussed with the patient.                            All questions were answered, and informed consent                            was obtained. Prior Anticoagulants: The patient has                            taken no previous anticoagulant or antiplatelet                            agents. ASA Grade Assessment: II - A patient with  mild systemic disease. After reviewing the risks                            and benefits, the patient was deemed in                            satisfactory condition to undergo the procedure.                           After obtaining informed consent, the endoscope was                            passed under direct vision. Throughout the                             procedure, the patient's blood pressure, pulse, and                            oxygen saturations were monitored continuously. The                            EG-[id number] ([id number]) scope was introduced through the                            mouth, and advanced to the second part of duodenum.                            The upper GI endoscopy was accomplished without                            difficulty. The patient tolerated the procedure                            well. Scope In: Scope Out: Findings:      Mucosal changes including feline appearance and longitudinal furrows       were found in the entire esophagus. Biopsies were taken with a cold       forceps for histology.      Food was found at the gastroesophageal junction. Removal of food was       accomplished. Estimated blood loss was minimal.      One moderate benign-appearing, intrinsic stenosis was found.      The exam of the esophagus was otherwise normal.      A single small submucosal papule (nodule) was found in the prepyloric       region of the stomach.      Patchy mild inflammation was found in the prepyloric region of the       stomach.      The exam of the stomach was otherwise normal.      The duodenal bulb, first portion of the duodenum and second portion of       the duodenum were normal. Impression:               - Esophageal mucosal changes consistent with  eosinophilic esophagitis. Biopsied.                           - Food at the gastroesophageal junction. Removal                            was successful.                           - Benign-appearing esophageal stenosis.                           - A single submucosal papule (nodule) found in the                            stomach.                           - Gastritis.                           - Normal duodenal bulb, first portion of the                            duodenum and second portion of the  duodenum. Moderate Sedation:      None Recommendation:           - Discharge patient to home (with escort).                           - Soft diet for 2 days.                           - Continue present medications.                           - Await pathology results.                           - Return to GI clinic in 3 weeks. Procedure Code(s):        --- Professional ---                           [phone], Esophagogastroduodenoscopy, flexible,                            transoral; with removal of foreign body(s)                           43239, Esophagogastroduodenoscopy, flexible,                            transoral; with biopsy, single or multiple Diagnosis Code(s):        --- Professional ---                           K20.9, Esophagitis, unspecified  M21.117B, Food in esophagus causing other injury,                            initial encounter                           K22.2, Esophageal obstruction                           K31.89, Other diseases of stomach and duodenum                           K29.70, Gastritis, unspecified, without bleeding                           T18.108A, Unspecified foreign body in esophagus                            causing other injury, initial encounter                           R13.14, Dysphagia, pharyngoesophageal phase CPT copyright 2016 American Medical Association. All rights reserved. The codes documented in this report are preliminary and upon coder review may  be revised to meet current compliance requirements. Arta Silence, MD 07/29/2017 1:35:18 PM This report has been signed electronically. Number of Addenda: 0

## 2017-07-29 NOTE — ED Notes (Signed)
This RN given phone number and information for pt friend who will pick pt and up and watch him for 4 hours at home post procedure.  Cathy Stewart: [phone]

## 2017-07-29 NOTE — Plan of Care (Signed)
Contacted by Dr. Jeanell Sparrow with Emergency [facility] of [facility].  Will deploy team to perform endoscopy in the ED as soon as team is available; I have discussed with our endoscopy charge nurse.

## 2017-07-29 NOTE — ED Notes (Signed)
Pt alert and oriented at this time. Pt texting on phone. This RN spoke with Oretha Caprice, will be here at around 3PM to pick pt up, take him home, and plans on staying with pt for a few hours to ensure patient safety after procedure.

## 2017-12-01 ENCOUNTER — Telehealth: Payer: Self-pay | Admitting: *Deleted

## 2017-12-01 NOTE — Telephone Encounter (Signed)
Telephone call from patient wife in regards to coding error. Bill was sent to them with a balance due. Coding was done in error according to insurance company. She would like to have the coding department call her back- [place]. Message was left on coding voicemail.

## 2018-12-24 ENCOUNTER — Encounter (HOSPITAL_COMMUNITY): Payer: Self-pay | Admitting: Emergency Medicine

## 2018-12-24 ENCOUNTER — Other Ambulatory Visit: Payer: Self-pay

## 2018-12-24 ENCOUNTER — Ambulatory Visit (HOSPITAL_COMMUNITY): Admit: 2018-12-24 | Payer: BC Managed Care – PPO | Admitting: Gastroenterology

## 2018-12-24 ENCOUNTER — Ambulatory Visit (HOSPITAL_COMMUNITY)
Admission: EM | Admit: 2018-12-24 | Discharge: 2018-12-24 | Disposition: A | Discharge status: Home / Self Care | Payer: BC Managed Care – PPO | Attending: Gastroenterology | Admitting: Gastroenterology

## 2018-12-24 ENCOUNTER — Encounter (HOSPITAL_COMMUNITY)
Admission: EM | Disposition: A | Discharge status: Home / Self Care | Payer: Self-pay | Source: Home / Self Care | Attending: Emergency Medicine

## 2018-12-24 ENCOUNTER — Emergency Department (HOSPITAL_COMMUNITY): Payer: BC Managed Care – PPO | Admitting: Certified Registered"

## 2018-12-24 DIAGNOSIS — K222 Esophageal obstruction: Secondary | ICD-10-CM

## 2018-12-24 DIAGNOSIS — Z1159 Encounter for screening for other viral diseases: Secondary | ICD-10-CM | POA: Diagnosis not present

## 2018-12-24 DIAGNOSIS — Z9049 Acquired absence of other specified parts of digestive tract: Secondary | ICD-10-CM | POA: Insufficient documentation

## 2018-12-24 DIAGNOSIS — K209 Esophagitis, unspecified: Secondary | ICD-10-CM | POA: Diagnosis not present

## 2018-12-24 DIAGNOSIS — Z85038 Personal history of other malignant neoplasm of large intestine: Secondary | ICD-10-CM | POA: Diagnosis not present

## 2018-12-24 DIAGNOSIS — X58XXXA Exposure to other specified factors, initial encounter: Secondary | ICD-10-CM | POA: Diagnosis not present

## 2018-12-24 DIAGNOSIS — Z1509 Genetic susceptibility to other malignant neoplasm: Secondary | ICD-10-CM | POA: Diagnosis not present

## 2018-12-24 DIAGNOSIS — T18128A Food in esophagus causing other injury, initial encounter: Secondary | ICD-10-CM | POA: Insufficient documentation

## 2018-12-24 HISTORY — PX: FOREIGN BODY REMOVAL: SHX962

## 2018-12-24 HISTORY — PX: ESOPHAGOGASTRODUODENOSCOPY (EGD) WITH PROPOFOL: SHX5813

## 2018-12-24 LAB — SARS CORONAVIRUS 2 BY RT PCR (HOSPITAL ORDER, PERFORMED IN ~~LOC~~ HOSPITAL LAB): SARS Coronavirus 2: NEGATIVE

## 2018-12-24 SURGERY — ESOPHAGOGASTRODUODENOSCOPY (EGD) WITH PROPOFOL
Anesthesia: General

## 2018-12-24 MED ORDER — LACTATED RINGERS IV SOLN
INTRAVENOUS | Status: DC
  Administered 2018-12-24: 17:00:00 via INTRAVENOUS

## 2018-12-24 MED ORDER — DEXAMETHASONE SODIUM PHOSPHATE 10 MG/ML IJ SOLN
INTRAMUSCULAR | Status: DC | PRN
  Administered 2018-12-24: 10 mg via INTRAVENOUS

## 2018-12-24 MED ORDER — LIDOCAINE 2% (20 MG/ML) 5 ML SYRINGE
INTRAMUSCULAR | Status: DC | PRN
  Administered 2018-12-24: 60 mg via INTRAVENOUS

## 2018-12-24 MED ORDER — FENTANYL CITRATE (PF) 100 MCG/2ML IJ SOLN
INTRAMUSCULAR | Status: DC | PRN
  Administered 2018-12-24: 100 ug via INTRAVENOUS

## 2018-12-24 MED ORDER — FENTANYL CITRATE (PF) 100 MCG/2ML IJ SOLN
INTRAMUSCULAR | Status: AC
  Filled 2018-12-24: qty 2

## 2018-12-24 MED ORDER — PROPOFOL 10 MG/ML IV BOLUS
INTRAVENOUS | Status: DC | PRN
  Administered 2018-12-24: 200 mg via INTRAVENOUS

## 2018-12-24 MED ORDER — SUCCINYLCHOLINE CHLORIDE 200 MG/10ML IV SOSY
PREFILLED_SYRINGE | INTRAVENOUS | Status: DC | PRN
  Administered 2018-12-24: 80 mg via INTRAVENOUS

## 2018-12-24 MED ORDER — ONDANSETRON HCL 4 MG/2ML IJ SOLN
INTRAMUSCULAR | Status: DC | PRN
  Administered 2018-12-24: 4 mg via INTRAVENOUS

## 2018-12-24 SURGICAL SUPPLY — 14 items

## 2018-12-24 NOTE — H&P (View-Only) (Signed)
[facility] Reason for consult: Food impaction Referring Physician: Patient.  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 male.  HPI: He has a history of prior food impactions.  On recent removal of food impaction 12/19 biopsies were obtained which showed marked eosinophilic esophagitis.  The patient has not as of yet been on topical steroids.  He was eating steak last night and it became lodged in his esophagus.  He was unable to swallow liquids or saliva.  This is happened before for short periods of time but would always pass.  He called this morning and tried nitroglycerin.  He had been given the suggestion in the past by Dr. Cristina Gong.  The nitroglycerin did not help.  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  He had a postoperative abdominal abscess requiring surgery.  Past Medical History:  Diagnosis Date  . Anemia   . Anemia due to GI blood loss 06/21/2012  . Blood transfusion without reported diagnosis   . Colon cancer ([place])   . Headache(784.0)    sinus  . Intra-abdominal abscess ([place]) 06/21/2012  . Upper GI bleeding 06/21/2012    Past Surgical History:  Procedure Laterality Date  . ABDOMINAL EXPLORATION SURGERY    . COLON SURGERY    . ESOPHAGOGASTRODUODENOSCOPY (EGD) WITH PROPOFOL Left 07/29/2017   Procedure: ESOPHAGOGASTRODUODENOSCOPY (EGD) WITH PROPOFOL;  Surgeon: Arta Silence, MD;  Location: [facility];  Service: Endoscopy;  Laterality: Left;  . EUS N/A 09/07/2012   Procedure: ESOPHAGEAL ENDOSCOPIC ULTRASOUND (EUS) RADIAL;  Surgeon: Arta Silence, MD;  Location[facility];  Service: Endoscopy;  Laterality: N/A;  . LAPAROTOMY  05/26/2012   Procedure: EXPLORATORY LAPAROTOMY;  Surgeon: Gwenyth Ober, MD;  Location: [facility];  Service: General;  Laterality: N/A;  with resection of Jejunal polyp and enterolysis  . SMALL INTESTINE SURGERY  2013  . TONSILLECTOMY      Family  History  Problem Relation Age of Onset  . Breast cancer Sister   . Breast cancer Maternal Grandmother   . Bladder Cancer Other     Social History:  reports that he has never smoked. He has never used smokeless tobacco. He reports current alcohol use. He reports that he does not use drugs.  Allergies:  Allergies  Allergen Reactions  . Nsaids Other (See Comments)    ulcer  . Oxaliplatin Anaphylaxis    Reacted during 6th dose (per pt)    Medications; Prior to Admission medications   Medication Sig Start Date End Date Taking? Authorizing Provider  ASTRAGALUS PO Take 2 tablets by mouth as needed (for immune system).    [provider]    PRN Meds  Results for orders placed or performed during the hospital encounter of 12/24/18 (from the past 48 hour(s))  SARS Coronavirus 2 (CEPHEID - Performed in [facility] hospital lab), Hosp Order     Status: None   Collection Time: 12/24/18  2:26 PM   Specimen: Nasopharyngeal Swab  Result Value Ref Range   SARS Coronavirus 2 NEGATIVE NEGATIVE    Comment: (NOTE) If result is NEGATIVE SARS-CoV-2 target nucleic acids are NOT DETECTED. The SARS-CoV-2 RNA is generally detectable in upper and lower  respiratory specimens during the acute phase of infection. The lowest  concentration of SARS-CoV-2 viral copies this assay can detect is 250  copies / mL. A negative result does not preclude SARS-CoV-2 infection  and should not be used as  the sole basis for treatment or other  patient management decisions.  A negative result may occur with  improper specimen collection / handling, submission of specimen other  than nasopharyngeal swab, presence of viral mutation(s) within the  areas targeted by this assay, and inadequate number of viral copies  (<250 copies / mL). A negative result must be combined with clinical  observations, patient history, and epidemiological information. If result is POSITIVE SARS-CoV-2 target nucleic acids are  DETECTED. The SARS-CoV-2 RNA is generally detectable in upper and lower  respiratory specimens dur ing the acute phase of infection.  Positive  results are indicative of active infection with SARS-CoV-2.  Clinical  correlation with patient history and other diagnostic information is  necessary to determine patient infection status.  Positive results do  not rule out bacterial infection or co-infection with other viruses. If result is PRESUMPTIVE POSTIVE SARS-CoV-2 nucleic acids MAY BE PRESENT.   A presumptive positive result was obtained on the submitted specimen  and confirmed on repeat testing.  While 2019 novel coronavirus  (SARS-CoV-2) nucleic acids may be present in the submitted sample  additional confirmatory testing may be necessary for epidemiological  and / or clinical management purposes  to differentiate between  SARS-CoV-2 and other Sarbecovirus currently known to infect humans.  If clinically indicated additional testing with an alternate test  methodology [phone]) is advised. The SARS-CoV-2 RNA is generally  detectable in upper and lower respiratory sp ecimens during the acute  phase of infection. The expected result is Negative. Fact Sheet for Patients:  StrictlyIdeas.no Fact Sheet for Healthcare Providers: BankingDealers.co.za This test is not yet approved or cleared by the [place] FDA and has been authorized for detection and/or diagnosis of SARS-CoV-2 by FDA under an Emergency Use Authorization (EUA).  This EUA will remain in effect (meaning this test can be used) for the duration of the COVID-19 declaration under Section 564(b)(1) of the Act, 21 U.S.C. section 360bbb-3(b)(1), unless the authorization is terminated or revoked sooner. Performed at [facility], [place] [address], [place], [place] [address]     No results found. ROS: Constitutional: Patient generally feels well HEENT:  Negative Cardiovascular: No cardiac history Respiratory: No asthma or history of pulmonary allergies GI: As above tends to have loose stools since he has had a subtotal colectomy GU: Negative Musculoskeletal: Negative Neuro/Psychiatric: Negative  endocrine/Heme: Negative            Blood pressure (!) 117/99, pulse 68, temperature 97.8 F (36.6 C), resp. rate 18, SpO2 99 %.  Physical exam:   General--Pleasant white male no acute distress ENT--we removed his mask and there is no gross oral lesions in the throat neck exam is normal Heart--regular rate and rhythm without murmurs or gallops  lungs--clear Abdomen--soft and nontender Psych--alert and oriented answers questions appropriately   Assessment: 1.  Food impaction with steak.  This is his third food impaction probably due to his eosinophilic esophagitis. 2.  Eosinophilic esophagitis 3.  Lynch syndrome status post subtotal colectomy 4.  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  We will proceed with EGD and removal of food impaction using propofol sedation.  Have discussed with patient this is his third episode and he is familiar with the procedure.   PRABHAV FAULKENBERRY 12/24/2018, 3:51 PM   This note was created using voice recognition software and minor errors may Have occurred unintentionally. Pager: [phone] If no answer  or after hours call [phone]

## 2018-12-24 NOTE — Anesthesia Procedure Notes (Signed)
Procedure Name: Intubation Date/Time: 12/24/2018 5:39 PM Performed by: Babs Bertin, CRNA Pre-anesthesia Checklist: Patient identified, Emergency Drugs available, Suction available and Patient being monitored Patient Re-evaluated:Patient Re-evaluated prior to induction Oxygen Delivery Method: Circle System Utilized Preoxygenation: Pre-oxygenation with 100% oxygen Induction Type: IV induction and Rapid sequence Laryngoscope Size: Mac and 3 Grade View: Grade II Tube type: Oral Tube size: 7.0 mm Number of attempts: 1 Airway Equipment and Method: Stylet and Oral airway Placement Confirmation: ETT inserted through vocal cords under direct vision,  positive ETCO2 and breath sounds checked- equal and bilateral Secured at: 23 cm Tube secured with: Tape Dental Injury: Teeth and Oropharynx as per pre-operative assessment

## 2018-12-24 NOTE — Anesthesia Preprocedure Evaluation (Signed)
Anesthesia Evaluation  Patient identified by MRN, date of birth, ID band Patient awake    Reviewed: Allergy & Precautions, NPO status , Patient's Chart, lab work & pertinent test results  History of Anesthesia Complications Negative for: history of anesthetic complications  Airway Mallampati: II  TM Distance: >3 FB Neck ROM: Full    Dental  (+) Teeth Intact, Dental Advisory Given   Pulmonary neg pulmonary ROS,    breath sounds clear to auscultation       Cardiovascular negative cardio ROS   Rhythm:Regular     Neuro/Psych  Headaches, negative psych ROS   GI/Hepatic Neg liver ROS, food impaction   Endo/Other  negative endocrine ROS  Renal/GU negative Renal ROS  negative genitourinary   Musculoskeletal   Abdominal   Peds  Hematology negative hematology ROS (+)   Anesthesia Other Findings   Reproductive/Obstetrics                             Anesthesia Physical Anesthesia Plan  ASA: I  Anesthesia Plan: General   Post-op Pain Management:    Induction: Rapid sequence and Cricoid pressure planned  PONV Risk Score and Plan: 2 and Ondansetron and Dexamethasone  Airway Management Planned: Oral ETT  Additional Equipment: None  Intra-op Plan:   Post-operative Plan: Extubation in OR  Informed Consent: I have reviewed the patients History and Physical, chart, labs and discussed the procedure including the risks, benefits and alternatives for the proposed anesthesia with the patient or authorized representative who has indicated his/her understanding and acceptance.     Dental advisory given  Plan Discussed with: CRNA and Surgeon  Anesthesia Plan Comments:         Anesthesia Quick Evaluation

## 2018-12-24 NOTE — Transfer of Care (Signed)
Immediate Anesthesia Transfer of Care Note  Patient: Gabriel Lee  Procedure(s) Performed: ESOPHAGOGASTRODUODENOSCOPY (EGD) WITH PROPOFOL (N/A ) FOREIGN BODY REMOVAL  Patient Location: PACU  Anesthesia Type:General  Level of Consciousness: awake, alert  and oriented  Airway & Oxygen Therapy: Patient Spontanous Breathing and Patient connected to nasal cannula oxygen  Post-op Assessment: Report given to RN and Post -op Vital signs reviewed and stable  Post vital signs: Reviewed and stable  Last Vitals:  Vitals Value Taken Time  BP 130/96 12/24/18 1813  Temp    Pulse    Resp 22 12/24/18 1813  SpO2    Vitals shown include unvalidated device data.  Last Pain:  Vitals:   12/24/18 1650  PainSc: 0-No pain         Complications: No apparent anesthesia complications

## 2018-12-24 NOTE — ED Provider Notes (Signed)
[facility] EMERGENCY DEPARTMENT Provider Note   CSN: [id number] Arrival date & time: 12/24/18  1254    History   Chief Complaint Chief Complaint  Patient presents with  . food impaction    HPI Gabriel Lee is a 58 y.o. male.     HPI   He states that since 9 PM last night while eating steak, he has not been able to eat or drink.  Since then, anything he eats or drinks, "comes right back up."  He feels like he has a food impaction.  Last, episode, several years ago.  He has never had an intervention beyond food removal.  He does not take PPIs because "of the side effects."  He states he contacted the GI doctor and needs to have a Covid-19 test, for procedure today.  He denies other recent illnesses including fever, chills, cough, dizziness or weakness.  There are no other known modifying factors.  Past Medical History:  Diagnosis Date  . Anemia   . Anemia due to GI blood loss 06/21/2012  . Blood transfusion without reported diagnosis   . Colon cancer ([place])   . Headache(784.0)    sinus  . Intra-abdominal abscess ([place]) 06/21/2012  . Upper GI bleeding 06/21/2012    Patient Active Problem List   Diagnosis Date Noted  . Upper GI bleeding 06/21/2012  . Intra-abdominal abscess ([place]) 06/21/2012  . Anemia due to GI blood loss 06/21/2012  . Postop check 06/09/2012  . Perforated intestine ([place]) 05/30/2012    Past Surgical History:  Procedure Laterality Date  . ABDOMINAL EXPLORATION SURGERY    . COLON SURGERY    . ESOPHAGOGASTRODUODENOSCOPY (EGD) WITH PROPOFOL Left 07/29/2017   Procedure: ESOPHAGOGASTRODUODENOSCOPY (EGD) WITH PROPOFOL;  Surgeon: Arta Silence, MD;  Location: [facility];  Service: Endoscopy;  Laterality: Left;  . EUS N/A 09/07/2012   Procedure: ESOPHAGEAL ENDOSCOPIC ULTRASOUND (EUS) RADIAL;  Surgeon: Arta Silence, MD;  [facility];  Service: Endoscopy;  Laterality: N/A;  . LAPAROTOMY  05/26/2012   Procedure: EXPLORATORY  LAPAROTOMY;  Surgeon: Gwenyth Ober, MD;  Location: [facility];  Service: General;  Laterality: N/A;  with resection of Jejunal polyp and enterolysis  . SMALL INTESTINE SURGERY  2013  . TONSILLECTOMY          Home Medications    Prior to Admission medications   Medication Sig Start Date End Date Taking? Authorizing Provider  ASTRAGALUS PO Take 2 tablets by mouth as needed (for immune system).    [provider]    Family History Family History  Problem Relation Age of Onset  . Breast cancer Sister   . Breast cancer Maternal Grandmother   . Bladder Cancer Other     Social History Social History   Tobacco Use  . Smoking status: Never Smoker  . Smokeless tobacco: Never Used  Substance Use Topics  . Alcohol use: Yes    Comment: occasionally  . Drug use: No     Allergies   Nsaids and Oxaliplatin   Review of Systems Review of Systems  All other systems reviewed and are negative.    Physical Exam Updated Vital Signs BP (!) 128/93   Pulse 66   Temp 97.8 F (36.6 C)   Resp 16   SpO2 98%   Physical Exam Vitals signs and nursing note reviewed.  Constitutional:      General: He is not in acute distress.    Appearance: He is well-developed. He is not ill-appearing, toxic-appearing  or diaphoretic.  HENT:     Head: Normocephalic and atraumatic.     Right Ear: External ear normal.     Left Ear: External ear normal.     Mouth/Throat:     Comments: He is not actively spitting saliva Eyes:     Conjunctiva/sclera: Conjunctivae normal.     Pupils: Pupils are equal, round, and reactive to light.  Neck:     Musculoskeletal: Normal range of motion and neck supple.     Trachea: Phonation normal.  Cardiovascular:     Rate and Rhythm: Normal rate.  Pulmonary:     Effort: Pulmonary effort is normal.  Musculoskeletal: Normal range of motion.  Skin:    General: Skin is warm and dry.  Neurological:     Mental Status: He is alert and oriented to person, place, and  time.     Cranial Nerves: No cranial nerve deficit.     Sensory: No sensory deficit.     Motor: No abnormal muscle tone.     Coordination: Coordination normal.  Psychiatric:        Mood and Affect: Mood normal.        Behavior: Behavior normal.        Thought Content: Thought content normal.        Judgment: Judgment normal.      ED Treatments / Results  Labs (all labs ordered are listed, but only abnormal results are displayed) Labs Reviewed  SARS CORONAVIRUS 2 (HOSPITAL ORDER, [facility])    EKG None  Radiology No results found.  Procedures Procedures (including critical care time)  Medications Ordered in ED Medications - No data to display   Initial Impression / Assessment and Plan / ED Course  I have reviewed the triage vital signs and the nursing notes.  Pertinent labs & imaging results that were available during my care of the patient were reviewed by me and considered in my medical decision making (see chart for details).  Clinical Course as of Dec 23 1612  Sat Dec 24, 2018  1614 Case discussed with gastroenterologist, Dr. Oletta Lamas, came to the ED to see and treat the patient.   [EW]    Clinical Course User Index [EW] Daleen Bo, MD        Patient Vitals for the past 24 hrs:  BP Temp Pulse Resp SpO2  12/24/18 1545 (!) 128/93 - 66 16 98 %  12/24/18 1530 (!) 125/96 - 62 15 99 %  12/24/18 1515 (!) 122/92 - 70 18 100 %  12/24/18 1500 119/88 - 65 19 99 %  12/24/18 1448 (!) 117/99 - 68 18 99 %  12/24/18 1439 - - 67 17 99 %  12/24/18 1258 100/71 97.8 F (36.6 C) 72 15 100 %    2:28 PM Reevaluation with update and discussion. After initial assessment and treatment, an updated evaluation reveals no change in clinical status, findings discussed with the patient. Daleen Bo   Medical Decision Making: Symptoms consistent with partial esophageal food bolus obstruction.  Plans are for gastroenterology to take the patient to  endoscopy for evaluation and treatment.  CRITICAL CARE-no Performed by: Daleen Bo  Nursing Notes Reviewed/ Care Coordinated Applicable Imaging Reviewed Interpretation of Laboratory Data incorporated into ED treatment   Plan, as per GI, for evaluation and treatment with endoscopy.   Final Clinical Impressions(s) / ED Diagnoses   Final diagnoses:  Esophageal obstruction    ED Discharge Orders    None  Daleen Bo, MD 12/24/18 [phone]

## 2018-12-24 NOTE — ED Triage Notes (Signed)
Pt arrives from [facility] GI doctor for food impaction since around 930 this am with hx of the same. Pt speaking in full sentences, resp e/u, nad.

## 2018-12-24 NOTE — Consult Note (Signed)
[place] Reason for consult: Food impaction Referring Physician: Patient.  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 male.  HPI: He has a history of prior food impactions.  On recent removal of food impaction 12/19 biopsies were obtained which showed marked eosinophilic esophagitis.  The patient has not as of yet been on topical steroids.  He was eating steak last night and it became lodged in his esophagus.  He was unable to swallow liquids or saliva.  This is happened before for short periods of time but would always pass.  He called this morning and tried nitroglycerin.  He had been given the suggestion in the past by Dr. Cristina Gong.  The nitroglycerin did not help.  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  He had a postoperative abdominal abscess requiring surgery.  Past Medical History:  Diagnosis Date  . Anemia   . Anemia due to GI blood loss 06/21/2012  . Blood transfusion without reported diagnosis   . Colon cancer ([place])   . Headache(784.0)    sinus  . Intra-abdominal abscess ([place]) 06/21/2012  . Upper GI bleeding 06/21/2012    Past Surgical History:  Procedure Laterality Date  . ABDOMINAL EXPLORATION SURGERY    . COLON SURGERY    . ESOPHAGOGASTRODUODENOSCOPY (EGD) WITH PROPOFOL Left 07/29/2017   Procedure: ESOPHAGOGASTRODUODENOSCOPY (EGD) WITH PROPOFOL;  Surgeon: Arta Silence, MD;  Location: [facility];  Service: Endoscopy;  Laterality: Left;  . EUS N/A 09/07/2012   Procedure: ESOPHAGEAL ENDOSCOPIC ULTRASOUND (EUS) RADIAL;  Surgeon: Arta Silence, MD;  Location: [facility];  Service: Endoscopy;  Laterality: N/A;  . LAPAROTOMY  05/26/2012   Procedure: EXPLORATORY LAPAROTOMY;  Surgeon: Gwenyth Ober, MD;  Location: [facility];  Service: General;  Laterality: N/A;  with resection of Jejunal polyp and enterolysis  . SMALL INTESTINE SURGERY  2013  . TONSILLECTOMY      Family  History  Problem Relation Age of Onset  . Breast cancer Sister   . Breast cancer Maternal Grandmother   . Bladder Cancer Other     Social History:  reports that he has never smoked. He has never used smokeless tobacco. He reports current alcohol use. He reports that he does not use drugs.  Allergies:  Allergies  Allergen Reactions  . Nsaids Other (See Comments)    ulcer  . Oxaliplatin Anaphylaxis    Reacted during 6th dose (per pt)    Medications; Prior to Admission medications   Medication Sig Start Date End Date Taking? Authorizing Provider  ASTRAGALUS PO Take 2 tablets by mouth as needed (for immune system).    [provider]    PRN Meds  Results for orders placed or performed during the hospital encounter of 12/24/18 (from the past 48 hour(s))  SARS Coronavirus 2 (CEPHEID - Performed in [facility] hospital lab), Hosp Order     Status: None   Collection Time: 12/24/18  2:26 PM   Specimen: Nasopharyngeal Swab  Result Value Ref Range   SARS Coronavirus 2 NEGATIVE NEGATIVE    Comment: (NOTE) If result is NEGATIVE SARS-CoV-2 target nucleic acids are NOT DETECTED. The SARS-CoV-2 RNA is generally detectable in upper and lower  respiratory specimens during the acute phase of infection. The lowest  concentration of SARS-CoV-2 viral copies this assay can detect is 250  copies / mL. A negative result does not preclude SARS-CoV-2 infection  and should not be used as  the sole basis for treatment or other  patient management decisions.  A negative result may occur with  improper specimen collection / handling, submission of specimen other  than nasopharyngeal swab, presence of viral mutation(s) within the  areas targeted by this assay, and inadequate number of viral copies  (<250 copies / mL). A negative result must be combined with clinical  observations, patient history, and epidemiological information. If result is POSITIVE SARS-CoV-2 target nucleic acids are  DETECTED. The SARS-CoV-2 RNA is generally detectable in upper and lower  respiratory specimens dur ing the acute phase of infection.  Positive  results are indicative of active infection with SARS-CoV-2.  Clinical  correlation with patient history and other diagnostic information is  necessary to determine patient infection status.  Positive results do  not rule out bacterial infection or co-infection with other viruses. If result is PRESUMPTIVE POSTIVE SARS-CoV-2 nucleic acids MAY BE PRESENT.   A presumptive positive result was obtained on the submitted specimen  and confirmed on repeat testing.  While 2019 novel coronavirus  (SARS-CoV-2) nucleic acids may be present in the submitted sample  additional confirmatory testing may be necessary for epidemiological  and / or clinical management purposes  to differentiate between  SARS-CoV-2 and other Sarbecovirus currently known to infect humans.  If clinically indicated additional testing with an alternate test  methodology [phone]) is advised. The SARS-CoV-2 RNA is generally  detectable in upper and lower respiratory sp ecimens during the acute  phase of infection. The expected result is Negative. Fact Sheet for Patients:  StrictlyIdeas.no Fact Sheet for Healthcare Providers: BankingDealers.co.za This test is not yet approved or cleared by the [place] FDA and has been authorized for detection and/or diagnosis of SARS-CoV-2 by FDA under an Emergency Use Authorization (EUA).  This EUA will remain in effect (meaning this test can be used) for the duration of the COVID-19 declaration under Section 564(b)(1) of the Act, 21 U.S.C. section 360bbb-3(b)(1), unless the authorization is terminated or revoked sooner. Performed at [facility], [place] [address], [place], [place] [address]     No results found. ROS: Constitutional: Patient generally feels well HEENT:  Negative Cardiovascular: No cardiac history Respiratory: No asthma or history of pulmonary allergies GI: As above tends to have loose stools since he has had a subtotal colectomy GU: Negative Musculoskeletal: Negative Neuro/Psychiatric: Negative  endocrine/Heme: Negative            Blood pressure (!) 117/99, pulse 68, temperature 97.8 F (36.6 C), resp. rate 18, SpO2 99 %.  Physical exam:   General--Pleasant white male no acute distress ENT--we removed his mask and there is no gross oral lesions in the throat neck exam is normal Heart--regular rate and rhythm without murmurs or gallops  lungs--clear Abdomen--soft and nontender Psych--alert and oriented answers questions appropriately   Assessment: 1.  Food impaction with steak.  This is his third food impaction probably due to his eosinophilic esophagitis. 2.  Eosinophilic esophagitis 3.  Lynch syndrome status post subtotal colectomy 4.  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  We will proceed with EGD and removal of food impaction using propofol sedation.  Have discussed with patient this is his third episode and he is familiar with the procedure.   JOVE BEYL 12/24/2018, 3:51 PM   This note was created using voice recognition software and minor errors may Have occurred unintentionally. Pager: [phone] If no answer  or after hours call [phone]

## 2018-12-24 NOTE — Op Note (Signed)
[facility] Patient Name: Gabriel Lee Procedure Date : 12/24/2018 MRN: [id number] Attending MD: Nancy Fetter Dr., MD Date of Birth: 04/13/1961 CSN: [id number] Age: 58 Admit Type: Emergency Department Procedure:                Upper GI endoscopy with removal of food impaction Indications:              Foreign body in the esophagus, pt ate steak last pm                            with obstruction x 24 hours. History of EoE Providers:                Jeneen Rinks L. Ulrick Methot Dr., MD, Carlyn Reichert, RN, Laverda Sorenson, Technician, Manuela Schwartz, CRNA Referring MD:              Medicines:                Monitored Anesthesia Care Complications:            No immediate complications. Estimated Blood Loss:     Estimated blood loss: none. Procedure:                Pre-Anesthesia Assessment:                           - Prior to the procedure, a History and Physical                            was performed, and patient medications and                            allergies were reviewed. The patient's tolerance of                            previous anesthesia was also reviewed. The risks                            and benefits of the procedure and the sedation                            options and risks were discussed with the patient.                            All questions were answered, and informed consent                            was obtained. Prior Anticoagulants: The patient has                            taken no previous anticoagulant or antiplatelet                            agents. ASA Grade Assessment: I - A normal, healthy  patient. After reviewing the risks and benefits,                            the patient was deemed in satisfactory condition to                            undergo the procedure.                           After obtaining informed consent, the endoscope was                            passed under  direct vision. Throughout the                            procedure, the patient's blood pressure, pulse, and                            oxygen saturations were monitored continuously. The                            [id number]-[id number] ([id number]) Olympus gastroscope was                            introduced through the mouth, and advanced to the                            second part of duodenum. The upper GI endoscopy was                            accomplished without difficulty. The patient                            tolerated the procedure well. Scope In: Scope Out: Findings:      Food was found in the lower third of the esophagus. Removal of food was       accomplished. Jabier Mutton net was used with 1 pass.      Esophagitis with no bleeding was found at the gastroesophageal junction.      The stomach was normal.      The examined duodenum was normal. Impression:               - Food in the lower third of the esophagus. Removal                            was successful.                           - Acute esophagitis. Probably due to prolonged food                            impaction.                           - Normal stomach.                           -  Normal examined duodenum. Recommendation:           - Patient has a contact number available for                            emergencies. The signs and symptoms of potential                            delayed complications were discussed with the                            patient. Return to normal activities tomorrow.                            Written discharge instructions were provided to the                            patient.                           - Full liquid diet today.                           - Use Prilosec (omeprazole) 20 mg PO daily for 1                            week.                           - Return to GI clinic in 1 month. Procedure Code(s):        --- Professional ---                           [phone],  Esophagogastroduodenoscopy, flexible,                            transoral; with removal of foreign body(s) Diagnosis Code(s):        --- Professional ---                           Q22.979G, Food in esophagus causing other injury,                            initial encounter                           K20.9, Esophagitis, unspecified                           T18.108A, Unspecified foreign body in esophagus                            causing other injury, initial encounter CPT copyright 2019 American Medical Association. All rights reserved. The codes documented in this report are preliminary and upon coder review may  be revised to meet current compliance requirements. Nancy Fetter Dr., MD 12/24/2018 6:08:20 PM This report has been signed electronically. Number  of Addenda: 0

## 2018-12-24 NOTE — Discharge Instructions (Addendum)
YOU HAD AN ENDOSCOPIC PROCEDURE TODAY: Refer to the procedure report and other information in the discharge instructions given to you for any specific questions about what was found during the examination. If this information does not answer your questions, please call [facility] office at [phone] to clarify.   YOU SHOULD EXPECT: Some feelings of bloating in the abdomen. Passage of more gas than usual. Walking can help get rid of the air that was put into your GI tract during the procedure and reduce the bloating. If you had a lower endoscopy (such as a colonoscopy or flexible sigmoidoscopy) you may notice spotting of blood in your stool or on the toilet paper. Some abdominal soreness may be present for a day or two, also.  DIET: Your first meal following the procedure should be a light meal and then it is ok to progress to your normal diet. A half-sandwich or bowl of soup is an example of a good first meal. Heavy or fried foods are harder to digest and may make you feel nauseous or bloated. Drink plenty of fluids but you should avoid alcoholic beverages for 24 hours. If you had a esophageal dilation, please see attached instructions for diet.   ACTIVITY: Your care partner should take you home directly after the procedure. You should plan to take it easy, moving slowly for the rest of the day. You can resume normal activity the day after the procedure however YOU SHOULD NOT DRIVE, use power tools, machinery or perform tasks that involve climbing or major physical exertion for 24 hours (because of the sedation medicines used during the test).   SYMPTOMS TO REPORT IMMEDIATELY: A gastroenterologist can be reached at any hour. Please call [phone]  for any of the following symptoms:   Following upper endoscopy (EGD, EUS, ERCP, esophageal dilation) Vomiting of blood or coffee ground material  New, significant abdominal pain  New, significant chest pain or pain under the shoulder blades  Painful or  persistently difficult swallowing  New shortness of breath  Black, tarry-looking or red, bloody stools  FOLLOW UP:  If any biopsies were taken you will be contacted by phone or by letter within the next 1-3 weeks. Call [phone]  if you have not heard about the biopsies in 3 weeks.  Please also call with any specific questions about appointments or follow up tests.  Liquids until am

## 2018-12-24 NOTE — Interval H&P Note (Signed)
History and Physical Interval Note:  12/24/2018 5:57 PM  Gabriel Lee  has presented today for surgery, with the diagnosis of food impaction.  The various methods of treatment have been discussed with the patient and family. After consideration of risks, benefits and other options for treatment, the patient has consented to  Procedure(s): ESOPHAGOGASTRODUODENOSCOPY (EGD) WITH PROPOFOL (N/A) FOREIGN BODY REMOVAL as a surgical intervention.  The patient's history has been reviewed, patient examined, no change in status, stable for surgery.  I have reviewed the patient's chart and labs.  Questions were answered to the patient's satisfaction.     Nancy Fetter

## 2018-12-25 ENCOUNTER — Encounter (HOSPITAL_COMMUNITY): Payer: Self-pay | Admitting: Gastroenterology

## 2018-12-26 NOTE — Anesthesia Postprocedure Evaluation (Signed)
Anesthesia Post Note  Patient: Gabriel Lee  Procedure(s) Performed: ESOPHAGOGASTRODUODENOSCOPY (EGD) WITH PROPOFOL (N/A ) FOREIGN BODY REMOVAL     Patient location during evaluation: PACU Anesthesia Type: General Level of consciousness: awake and alert Pain management: pain level controlled Vital Signs Assessment: post-procedure vital signs reviewed and stable Respiratory status: spontaneous breathing, nonlabored ventilation, respiratory function stable and patient connected to nasal cannula oxygen Cardiovascular status: blood pressure returned to baseline and stable Postop Assessment: no apparent nausea or vomiting Anesthetic complications: no    Last Vitals:  Vitals:   12/24/18 1829 12/24/18 1845  BP: (!) 128/93 123/90  Pulse: 75 70  Resp: 13 14  Temp:  (!) 36.4 C  SpO2: 97% 99%    Last Pain:  Vitals:   12/24/18 1845  PainSc: 0-No pain                 Cyrstal Leitz
